# Patient Record
Sex: Male | Born: 1940 | Race: White | Hispanic: No | Marital: Married | Smoking: Never smoker
Health system: Southern US, Community
[De-identification: ages and names within clinical notes are randomized; demographics above are authoritative.]

## PROBLEM LIST (undated history)

## (undated) DIAGNOSIS — I10 Essential (primary) hypertension: Secondary | ICD-10-CM

## (undated) DIAGNOSIS — N289 Disorder of kidney and ureter, unspecified: Secondary | ICD-10-CM

## (undated) DIAGNOSIS — I8390 Asymptomatic varicose veins of unspecified lower extremity: Secondary | ICD-10-CM

## (undated) DIAGNOSIS — Z972 Presence of dental prosthetic device (complete) (partial): Secondary | ICD-10-CM

## (undated) DIAGNOSIS — Z974 Presence of external hearing-aid: Secondary | ICD-10-CM

## (undated) DIAGNOSIS — Z973 Presence of spectacles and contact lenses: Secondary | ICD-10-CM

## (undated) DIAGNOSIS — M1990 Unspecified osteoarthritis, unspecified site: Secondary | ICD-10-CM

## (undated) DIAGNOSIS — L97309 Non-pressure chronic ulcer of unspecified ankle with unspecified severity: Secondary | ICD-10-CM

## (undated) DIAGNOSIS — I480 Paroxysmal atrial fibrillation: Secondary | ICD-10-CM

## (undated) DIAGNOSIS — E785 Hyperlipidemia, unspecified: Secondary | ICD-10-CM

## (undated) DIAGNOSIS — I4821 Permanent atrial fibrillation: Secondary | ICD-10-CM

## (undated) HISTORY — PX: COLONOSCOPY: SHX174

## (undated) HISTORY — DX: Hyperlipidemia, unspecified: E78.5

## (undated) HISTORY — PX: NASAL SINUS SURGERY: SHX719

## (undated) HISTORY — DX: Non-pressure chronic ulcer of unspecified ankle with unspecified severity: L97.309

## (undated) HISTORY — DX: Asymptomatic varicose veins of unspecified lower extremity: I83.90

## (undated) HISTORY — DX: Paroxysmal atrial fibrillation: I48.0

## (undated) HISTORY — DX: Permanent atrial fibrillation: I48.21

## (undated) HISTORY — DX: Essential (primary) hypertension: I10

## (undated) HISTORY — PX: OTHER SURGICAL HISTORY: SHX169

---

## 2002-06-08 ENCOUNTER — Ambulatory Visit (HOSPITAL_COMMUNITY): Admission: RE | Admit: 2002-06-08 | Discharge: 2002-06-08 | Payer: Self-pay | Admitting: Gastroenterology

## 2007-01-27 ENCOUNTER — Encounter: Admission: RE | Admit: 2007-01-27 | Discharge: 2007-01-27 | Payer: Self-pay | Admitting: Endocrinology

## 2010-01-22 ENCOUNTER — Encounter: Payer: Self-pay | Admitting: Endocrinology

## 2010-05-19 NOTE — Op Note (Signed)
   NAME:  Joseph Landry, Joseph Landry                          ACCOUNT NO.:  [id number]   MEDICAL RECORD NO.:  [id number]                   PATIENT TYPE:  AMB   LOCATION:  ENDO                                 FACILITY:  [facility]   PHYSICIAN:  Grayton L. Rolla Flatten., M.D.          DATE OF BIRTH:  07-Jul-1940   DATE OF PROCEDURE:  06/08/2002  DATE OF DISCHARGE:                                 OPERATIVE REPORT   PROCEDURE:  Colonoscopy.   MEDICATIONS:  1. Fentanyl 87.5 mcg IV.  2. Versed 9 mg IV.   ENDOSCOPE:  Olympus pediatric colonoscope.   INDICATIONS FOR PROCEDURE:  Colon cancer screening.   DESCRIPTION OF PROCEDURE:  The procedure had been explained to the patient  and consent obtained.  With the patient in the left lateral decubitus  position, the scope was withdrawn.  The colon was carefully examined upon  withdrawal.   The ascending colon, transverse colon, splenic flexure, descending and  sigmoid colon were all seen well without polyps.  There were no further  abnormalities seen throughout the colon.  The scope was withdrawn and the  patient tolerated the procedure well.   ASSESSMENT:  Essentially normal colonoscopy.   PLAN:  Routine follow-up with yearly Hemoccults.  Consider another screen in  5-10 years.                                                Hersel L. Rolla Flatten., M.D.    Jaynie Bream  D:  06/08/2002  T:  06/08/2002  Job:  [id number]   cc:   Ronaldo Miyamoto, M.D.  [address] [place]  [place] [address]  Fax: [phone]

## 2010-08-02 HISTORY — PX: OTHER SURGICAL HISTORY: SHX169

## 2010-08-02 HISTORY — PX: TRANSTHORACIC ECHOCARDIOGRAM: SHX275

## 2012-05-15 ENCOUNTER — Telehealth: Payer: Self-pay | Admitting: Cardiology

## 2012-05-15 NOTE — Telephone Encounter (Signed)
Received a fax from [facility] asking if the patient can temporarily stop taking his Pradaxa.  05-15-12 ST.

## 2012-07-21 ENCOUNTER — Ambulatory Visit (INDEPENDENT_AMBULATORY_CARE_PROVIDER_SITE_OTHER): Payer: Medicare Other | Admitting: Cardiology

## 2012-07-21 VITALS — BP 170/88 | HR 48 | Ht 72.0 in | Wt 172.4 lb

## 2012-07-21 DIAGNOSIS — I4891 Unspecified atrial fibrillation: Secondary | ICD-10-CM

## 2012-07-21 DIAGNOSIS — E785 Hyperlipidemia, unspecified: Secondary | ICD-10-CM

## 2012-07-21 DIAGNOSIS — I10 Essential (primary) hypertension: Secondary | ICD-10-CM

## 2012-07-22 MED ORDER — FLECAINIDE ACETATE 50 MG PO TABS: 50.0000 mg | ORAL_TABLET | Freq: Two times a day (BID) | ORAL | Status: DC

## 2012-07-22 NOTE — Patient Instructions (Addendum)
Your physician wants you to follow-up in 6 month with Joseph Landry Jan 2015;12 month with Dr Joseph Landry will receive a reminder letter in the mail two months in advance. If you don't receive a letter, please call our office to schedule the follow-up appointment.

## 2012-08-06 ENCOUNTER — Other Ambulatory Visit: Payer: Self-pay | Admitting: *Deleted

## 2012-08-06 ENCOUNTER — Encounter: Payer: Self-pay | Admitting: *Deleted

## 2012-08-06 NOTE — Telephone Encounter (Signed)
Pt would like to keep his prescription as is.  100 mg tablet. Take 1/2 tablet twice a day. (= 50 mg bid)

## 2012-08-08 ENCOUNTER — Encounter: Payer: Self-pay | Admitting: Cardiology

## 2012-08-08 DIAGNOSIS — E785 Hyperlipidemia, unspecified: Secondary | ICD-10-CM | POA: Insufficient documentation

## 2012-08-08 DIAGNOSIS — I4811 Longstanding persistent atrial fibrillation: Secondary | ICD-10-CM | POA: Insufficient documentation

## 2012-08-08 DIAGNOSIS — I10 Essential (primary) hypertension: Secondary | ICD-10-CM | POA: Insufficient documentation

## 2012-08-08 NOTE — Assessment & Plan Note (Signed)
On statin.  Moderate upright position.  Stable.

## 2012-08-08 NOTE — Assessment & Plan Note (Addendum)
Full control on current regimen.  No recurrence since on flecainide.  Otherwise probably back off as metoprolol, he has been taking 100 mg daily. He does continue to have bradycardia, we may need to back off on his beta blocker which would mean we have to add something else for blood pressure control.  Cannot stop beta blocker while on flecainide.  We did already back down to 50 mg twice a day of flecainide. (We will need to clarify from him what dose of metoprolol he has been taking.) He is not have any problems or bleeding on Pradaxa.  No untoward symptoms of nausea.    Plan: Continue flecainide 50 mg twice a day, metoprolol succinate at 100 mg daily for now but can reduce to 50 mg if he does note fatigue, and Pradaxa for anticoagulation

## 2012-08-08 NOTE — Progress Notes (Signed)
Patient ID: Joseph Landry, male   DOB: September 04, 1940, 72 y.o.   MRN: [id number] PCP: No primary provider on file.  Clinic Note: Chief Complaint  Patient presents with  . Follow-up    Atrial fibrillation; energy is much better, feeling overall better   HPI: Joseph Landry is a 72 y.o. male with a PMH most notable for PAF who presents today for routine followup. He was first referred to me back in August 2012 with new diagnosis of A. fib.  Due to full cardiac evaluation which is relatively benign.  With the exception of hypertension and dyslipidemia, he has no other cardiac risk factors.  He initially tried him on Multaq, he was intolerant of Multaq, and had recurrence of A. fib.  He was converted to Flecainide which is done well with preserving normal sinus rhythm, but he has had bradycardia which is let us to decrease both his metoprolol and flecainide dose.  Interval History: He returns today for what amounts to be a prolonged six-month followup.  He is doing extremely well.  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  His walking around or breaks.  He is trying to get back to his routine exercise level.  He just got back from her trip to [place] where he spent for 5 days walker and [place] with his grandson.  His ejection blood pressure at home has been relatively controlled.  He is not sure what happened today the side effects he was somewhat rushed getting to his visit.  As far as he can tell, last on his atrial fibrillation was back in November when we took him off the Multaq and converted to flecainide.  Since we backed off on the dose as his energy levels gotten much better.  Otherwise, the remainder of cardiac review of systems is as follows: Cardiovascular ROS: no chest pain or dyspnea on exertion negative for - edema, irregular heartbeat, loss of consciousness, murmur, orthopnea, palpitations, paroxysmal nocturnal dyspnea, rapid heart rate or shortness  of breath, lightheadedness, dizziness, wooziness, syncope/near-syncope.  No TIA or amaurosis fugax symptoms.  No melena, hematochezia or hematuria  Past Medical History  Diagnosis Date  . PAF (paroxysmal atrial fibrillation)     Rate controlled with beta blocker; rhythm control with flecainide; panic regulation with Pradaxa  . Dyslipidemia      on  simvastatin   . Hypertension     Labile; often elevated in clinic visits, but never at home   Prior Cardiac Evaluation and Past Surgical History: Past Surgical History  Procedure Laterality Date  . Transthoracic echocardiogram  August 2012    Normal LV size and function, normal LA size, EF> 55%.  . Nm myoview ltd - persantine  August 2012    No infarct or ischemia.   Allergies  Allergen Reactions  . Multaq (Dronedarone) Nausea Only    Significant bradycardia    Current Outpatient Prescriptions  Medication Sig Dispense Refill  . dabigatran (PRADAXA) 150 MG CAPS capsule Take 150 mg by mouth every 12 (twelve) hours.      . flecainide (TAMBOCOR) 100 MG tablet Take 1/2 tablet by mouth.  Twice a day      . folic acid (FOLVITE) 1 MG tablet Take 1 mg by mouth daily.      . hydrochlorothiazide (HYDRODIURIL) 25 MG tablet Take 25 mg by mouth daily.      . metoprolol succinate (TOPROL-XL) 50 MG 24 hr tablet Take  50 mg by mouth daily. Take with or immediately following a meal.      . Omega-3 Fatty Acids (FISH OIL) 1000 MG CAPS Take by mouth.      . simvastatin (ZOCOR) 40 MG tablet Take 40 mg by mouth every evening.      . vitamin A 8000 UNIT capsule Take 8,000 Units by mouth daily.      . vitamin C (ASCORBIC ACID) 500 MG tablet Take 500 mg by mouth daily.      . vitamin E (VITAMIN E) 400 UNIT capsule Take 400 Units by mouth daily.       No current facility-administered medications for this visit.   Social History Narrative   He is a married father of 2, grandfather of 1.  He is very active but not with routine exercise. He previously was  walking 2to 3 miles a day and he is trying to get back to that. An occasional social alcoholic drink but does not smoke.       He recently went on a trip with his daughter and grandson to [place] and was able to walk all throughout [employer] [place] without any problems.   ROS: A comprehensive Review of Systems - Was essentially negative.  No weight loss or gain, no fevers chills sweats, no muscular skeletal symptoms.  No claudication symptoms.  No GI or GU symptoms.  No neurologic deficits per no coughing or wheezing or other illnesses.  Otherwise cardiovascular symptoms are negative as above  PHYSICAL EXAM BP 170/88  Pulse 48  Ht 6' (1.829 m)  Wt 172 lb 6.4 oz (78.2 kg)  BMI 23.38 kg/m2 General appearance: alert, cooperative, appears stated age, no distress and Relatively thin but healthy-appearing.  Well-nourished and well-groomed.  Answers questions appropriately. HEENT: [place]/AT, EOMI, MMM, anicteric sclera Neck: no adenopathy, no carotid bruit, no JVD, supple, symmetrical, trachea midline and thyroid not enlarged, symmetric, no tenderness/mass/nodules Lungs: clear to auscultation bilaterally, normal percussion bilaterally and Nonlabored, good air movement Heart: Bradycardic rate and rhythm, S1, S2 normal, no murmur, click, rub or gallop and normal apical impulse Abdomen: soft, non-tender; bowel sounds normal; no masses,  no organomegaly Extremities: extremities normal, atraumatic, no cyanosis or edema, no edema, redness or tenderness in the calves or thighs and no ulcers, gangrene or trophic changes Pulses: 2+ and symmetric  [id number] today: Yes Rate: 48 , Rhythm: Sinus bradycardia with first-degree AV block, otherwise essentially normal, no significant change.   Recent Labs: None checked  ASSESSMENT / PLAN: Stable PAF.  Labile blood pressures.  No problem-specific assessment & plan notes found for this encounter.   Orders Placed This Encounter  Procedures  . EKG 12-Lead    Followup: Six-month followup with Tarri Fuller, PA; 1 year followup with Dr. Leonie Man. Ellyn Hack, M.D., M.S. [facility] [address] [place], [place]  [address]  [phone] Pager # [phone]

## 2012-08-08 NOTE — Assessment & Plan Note (Addendum)
His blood pressure is quite elevated today.  It is never been this high before.  I rechecked it prior to him leaving it was close to 140/70 mmHg.  He does say was a somewhat irritated with his trip to the office today. More than likely, we will need to add additional blood pressure medication.  He is reluctant to take any more medicines; however, if his blood pressures as high as it is now we'll need more control.  I would probably opt for ACE inhibitor or ARB since he has atrial fibrillation and these have been shown to be beneficial.

## 2012-10-27 ENCOUNTER — Other Ambulatory Visit: Payer: Self-pay | Admitting: Cardiology

## 2012-10-27 NOTE — Telephone Encounter (Signed)
Rx was sent to pharmacy electronically. 

## 2013-01-19 ENCOUNTER — Ambulatory Visit (INDEPENDENT_AMBULATORY_CARE_PROVIDER_SITE_OTHER): Payer: Medicare Other | Admitting: Physician Assistant

## 2013-01-19 ENCOUNTER — Encounter: Payer: Self-pay | Admitting: Physician Assistant

## 2013-01-19 VITALS — BP 172/90 | Ht 72.0 in | Wt 179.0 lb

## 2013-01-19 DIAGNOSIS — I4891 Unspecified atrial fibrillation: Secondary | ICD-10-CM

## 2013-01-19 DIAGNOSIS — I480 Paroxysmal atrial fibrillation: Secondary | ICD-10-CM

## 2013-01-19 DIAGNOSIS — I10 Essential (primary) hypertension: Secondary | ICD-10-CM

## 2013-01-19 NOTE — Patient Instructions (Signed)
1.  Target BP at less than 130/80. 2.  Follow up with Dr. Ellyn Hack in 6 months.

## 2013-01-19 NOTE — Assessment & Plan Note (Signed)
Maintaining sinus bradycardia with first degree AVB.  Flecainide, Pradaxa, Toprol XL.  No changes to current therapy.

## 2013-01-19 NOTE — Progress Notes (Signed)
Patient ID: Joseph Landry, male   DOB: August 20, 1940, 73 y.o.   MRN: [id number]    Date:  01/19/2013   ID:  Joseph Landry, DOB 1940/01/08, MRN [id number]  PCP:  Dwan Bolt, MD  Primary Cardiologist:  Ellyn Hack    History of Present Illness: Joseph Landry is a 73 y.o. male with a PMH most notable for PAF, HTN and dyslipidemia.  He initially tried him on Multaq, he was intolerant of Multaq, and had recurrence of A. fib. He was converted to Flecainide which is done well with preserving normal sinus rhythm, but he has had bradycardia which is let us to decrease both his metoprolol and flecainide dose.  The patient presents today for six month evaluation.  He reports doing well.  He exercises by walking on the treadmill several times per week.  He checks his Bp daily and ranges 122-148 sys, 46-61 dias.   ROS: The patient currently denies nausea, vomiting, fever, chest pain, shortness of breath, orthopnea, dizziness, PND, cough, congestion, abdominal pain, hematochezia, melena, lower extremity edema, claudication.  Wt Readings from Last 3 Encounters:  01/19/13 179 lb (81.194 kg)  07/21/12 172 lb 6.4 Joseph (78.2 kg)     Past Medical History  Diagnosis Date  . PAF (paroxysmal atrial fibrillation)     Rate controlled with beta blocker; rhythm control with flecainide; panic regulation with Pradaxa  . Dyslipidemia      on  simvastatin   . Hypertension     Labile; often elevated in clinic visits, but never at home    Current Outpatient Prescriptions  Medication Sig Dispense Refill  . dabigatran (PRADAXA) 150 MG CAPS capsule Take 150 mg by mouth every 12 (twelve) hours.      . flecainide (TAMBOCOR) 100 MG tablet TAKE 1 TABLET BY MOUTH EVERY 12 HOURS  60 tablet  6  . folic acid (FOLVITE) 1 MG tablet Take 1 mg by mouth daily.      . metoprolol succinate (TOPROL-XL) 50 MG 24 hr tablet Take 50 mg by mouth daily. Take with or immediately following a meal.      . Omega-3 Fatty Acids (FISH OIL)  1000 MG CAPS Take by mouth.      . simvastatin (ZOCOR) 40 MG tablet Take 40 mg by mouth every evening.      . triamterene-hydrochlorothiazide (DYAZIDE) 37.5-25 MG per capsule Take 1 capsule by mouth daily.      . vitamin A 8000 UNIT capsule Take 8,000 Units by mouth daily.      . vitamin C (ASCORBIC ACID) 500 MG tablet Take 500 mg by mouth daily.      . vitamin E (VITAMIN E) 400 UNIT capsule Take 400 Units by mouth daily.       No current facility-administered medications for this visit.    Allergies:    Allergies  Allergen Reactions  . Multaq [Dronedarone] Nausea Only    Significant bradycardia    Social History:  The patient  reports that he has never smoked. He does not have any smokeless tobacco history on file. He reports that he drinks alcohol. He reports that he does not use illicit drugs.   Family history:  History reviewed. No pertinent family history.  ROS:  Please see the history of present illness.  All other systems reviewed and negative.   PHYSICAL EXAM: VS:  BP 172/90  Ht 6' (1.829 m)  Wt 179 lb (81.194 kg)  BMI 24.27 kg/m2 Well nourished, well developed, in  no acute distress HEENT: Pupils are equal round react to light accommodation extraocular movements are intact.  Neck: no JVDNo cervical lymphadenopathy. Cardiac: Regular rhythm rate slow without murmurs rubs or gallops. Lungs:  clear to auscultation bilaterally, no wheezing, rhonchi or rales Abd: soft, nontender, positive bowel sounds all quadrants, no hepatosplenomegaly Ext: no lower extremity edema.  2+ radial and dorsalis pedis pulses. Skin: warm and dry Neuro:  Grossly normal  EKG:  Sinus Bradycardia 53 BPM, First degree AVB,  Unchanged  ASSESSMENT AND PLAN:  Problem List Items Addressed This Visit   PAF (paroxysmal atrial fibrillation) - Primary (Chronic)     Maintaining sinus bradycardia with first degree AVB.  Flecainide, Pradaxa, Toprol XL.  No changes to current therapy.    Relevant  Medications      triamterene-hydrochlorothiazide (DYAZIDE) 37.5-25 MG per capsule   Other Relevant Orders      EKG 12-Lead   Hypertension (Chronic)     BP elevated here today and usually is when he comes in.  Fairly well controlled at home.  99991111 systolic.

## 2013-01-19 NOTE — Assessment & Plan Note (Signed)
BP elevated here today and usually is when he comes in.  Fairly well controlled at home.  99991111 systolic.

## 2013-06-02 ENCOUNTER — Telehealth: Payer: Self-pay | Admitting: Cardiology

## 2013-06-02 NOTE — Telephone Encounter (Signed)
DR HILTY SPOKE TO DR Wilson Singer  DR HILTY ROUTED INFORMATION TO DR HARDING.

## 2013-06-02 NOTE — Telephone Encounter (Signed)
Saw patient yesterday with hematuria.  Dr Wilson Singer asked him to stop pradexa (Dr Ellyn Hack prescribed)

## 2013-08-14 ENCOUNTER — Encounter: Payer: Self-pay | Admitting: *Deleted

## 2013-08-17 ENCOUNTER — Ambulatory Visit (INDEPENDENT_AMBULATORY_CARE_PROVIDER_SITE_OTHER): Payer: Medicare Other | Admitting: Cardiology

## 2013-08-17 ENCOUNTER — Encounter: Payer: Self-pay | Admitting: Cardiology

## 2013-08-17 VITALS — BP 180/74 | HR 52 | Ht 71.0 in | Wt 181.6 lb

## 2013-08-17 DIAGNOSIS — E785 Hyperlipidemia, unspecified: Secondary | ICD-10-CM

## 2013-08-17 DIAGNOSIS — K625 Hemorrhage of anus and rectum: Secondary | ICD-10-CM

## 2013-08-17 DIAGNOSIS — I10 Essential (primary) hypertension: Secondary | ICD-10-CM

## 2013-08-17 DIAGNOSIS — I480 Paroxysmal atrial fibrillation: Secondary | ICD-10-CM

## 2013-08-17 DIAGNOSIS — I4891 Unspecified atrial fibrillation: Secondary | ICD-10-CM

## 2013-08-17 DIAGNOSIS — Z87448 Personal history of other diseases of urinary system: Secondary | ICD-10-CM

## 2013-08-17 NOTE — Patient Instructions (Signed)
Hold Pradaxa  Two days before and day of your flight to and from.   If there are no bleeding issues you can stay on Pradaxa if you do have bleeding you can switch to taking an 81mg  Aspirin daily.

## 2013-08-18 DIAGNOSIS — Z87448 Personal history of other diseases of urinary system: Secondary | ICD-10-CM | POA: Insufficient documentation

## 2013-08-18 DIAGNOSIS — Z8719 Personal history of other diseases of the digestive system: Secondary | ICD-10-CM | POA: Insufficient documentation

## 2013-08-18 NOTE — Assessment & Plan Note (Signed)
No real gross finding from his report of GI evaluation. My recommendation is usually to do a month or 2 off of anticoagulation to allow healing and then restarting. If there is bleeding again with Pradaxa, I would consider switching to [facility]. If not on Pradaxa, he should be on at least 81 mg aspirin  CHA2DS2Vasc Score 2.

## 2013-08-18 NOTE — Assessment & Plan Note (Signed)
Probably related to nephrolithiasis. Should not be a reason to avoid anticoagulation

## 2013-08-18 NOTE — Assessment & Plan Note (Signed)
On statin. Monitored by PCP. Notable improvement in April 2015 labs

## 2013-08-18 NOTE — Assessment & Plan Note (Signed)
Strangely, he continues to have elevated blood pressures during evaluations in the office, but has not had the same findings when he checks his blood pressure at home or at the gym. Usually his blood pressures range from 126/51 mm Hg to 143/69 mmHg which is well within normal range.  For now I think we'll continue with his current medications. We may need to use either an ACE inhibitor or ARB for additional blood pressure control which would potentially allow [place] to reduce his Toprol dose to avoid excess bradycardia

## 2013-08-18 NOTE — Assessment & Plan Note (Addendum)
Continued to be rate controlled with sinus bradycardia first degree AV block. On flecainide plus Toprol, as long as the heart rate is still in the roughly 50 beats per minute range I am fine with continuing the current dose of Toprol. Currently anticoagulation is on hold due to his GI bleed and hematuria. I have asked that he start taking 81 mg aspirin while not on Pradaxa.  I have suggested that with his upcoming trip to [place], he may want to be on the Pradaxa for the flight there and back. Maybe not take it while he is there, but use it essentially for DVT prophylaxis. If no further bleeding is noted during the trip back, he may still restart it. If there is further episodes of bleeding, I would consider switching to [facility]. But in the interim he would stop Pradaxa and take aspirin.

## 2013-08-18 NOTE — Progress Notes (Signed)
PCP: Dwan Bolt, MD  Clinic Note: Chief Complaint  Patient presents with  . 6 month visit    no chest pain , no sob , no edema , no palp--- not using Pradaxa- due bleeding been off since JUNE 2015- ---GOING TO [place] nex t month   HPI: Joseph Landry is a 73 y.o. male with a Cardiovascular Problem List below who presents today for what amounts to a 7 month followup. He did see Tenny Craw, PA-C. in January and me in August of last year. He essentially has hypertension and paroxysmal atrial fibrillation treated with metoprolol for rate control and has been on Pradaxa for anticoagulation.  Interval History: Joseph Landry is unrewarding last 6 months or so without any major complaints from a cardiac standpoint. He really has not noted any recurrence of atrial fibrillation type sensations. No rapid or irregular heartbeats, no chest tightness or pressure with rest or exertion. No exertional dyspnea. He is still active but maybe not as much the used to be. He has an upcoming trip to [place] planned. He denies any PND, orthopnea or edema. No TA/amaurosis fugax or syncopal/near-syncope type symptoms.  The notable issues in the last several months have been a couple days of hematuria and mild intermittent blood in stool. He is on for couple by both urology and gastroenterology without any major findings besides mild nephrolithiasis and possible anal fissure. He was found to be anemic according to his report with hemoglobin to 9 range. This subsequently improved with iron therapy. Since the initial bleeding episode, he has not been taking Pradaxa.  Past Medical History  Diagnosis Date  . PAF (paroxysmal atrial fibrillation)     Rate controlled with beta blocker; rhythm control with flecainide; panic regulation with Pradaxa  . Dyslipidemia      on  simvastatin   . Hypertension     Labile; often elevated in clinic visits, but never at home    Prior Cardiac Evaluation and Past Surgical History: Past  Surgical History  Procedure Laterality Date  . Transthoracic echocardiogram  August 2012    Normal LV size and function, normal LA size, EF> 55%.  . Nm myoview ltd - persantine  August 2012    No infarct or ischemia.  . Event monitor  08/22/2011-09/20/2011    shows a fib- startedon FLECAINDE    MEDICATIONS AND ALLERGIES REVIEWED IN EPIC No Change in Social and Family History  ROS: A comprehensive Review of Systems was performed Review of Systems  Constitutional: Negative for fever and chills.       Weight Gain  HENT: Negative.   Respiratory: Negative for cough, hemoptysis, sputum production and wheezing.   Cardiovascular: Negative.        Per HPI  Gastrointestinal: Positive for blood in stool. Negative for abdominal pain and melena.       Evaluated by GI after noted ~1-2 days of "specks of blood in stool", noted to be anemic, started on Iron No significant EGD or Colonoscopy findings.  Genitourinary: Positive for hematuria. Negative for dysuria and flank pain.       ~2 days of hematuria - resolved Full Urology evaluation with CT & cystoscopy --> only small stones noted; presumed hematuria was related to passing small stones  Skin: Negative.   Neurological: Negative for dizziness, sensory change, speech change, focal weakness, seizures and loss of consciousness.  Endo/Heme/Allergies: Does not bruise/bleed easily.  All other systems reviewed and are negative.  Wt Readings from Last 3 Encounters:  08/17/13 181 lb 9.6 oz (82.373 kg)  01/19/13 179 lb (81.194 kg)  07/21/12 172 lb 6.4 oz (78.2 kg)   PHYSICAL EXAM BP 180/74  Pulse 52  Ht 5\' 11"  (1.803 m)  Wt 181 lb 9.6 oz (82.373 kg)  BMI 25.34 kg/m2 General appearance: alert, cooperative, appears stated age, no distress; well-nourished and well-groomed HEENT: [place]/AT, EOMI, MMM, anicteric sclera Neck: no adenopathy, no carotid bruit and no JVD Lungs: clear to auscultation bilaterally, normal percussion bilaterally and  non-labored Heart: regular rate and rhythm, S1, S2 normal, no murmur, click, rub or gallop; nondisplaced PMI Abdomen: soft, non-tender; bowel sounds normal; no masses,  no organomegaly; Extremities: extremities normal, atraumatic, no cyanosis, or edema Pulses: 2+ and symmetric; Neurologic: Mental status: Alert, oriented, thought content appropriate; grossly normal   Adult ECG Report  Rate: 52 ;  Rhythm: sinus bradycardia and First degree AV block; otherwise normal EKG   Recent Labs April 2015:   TC 131, TG 85, HDL 39 (up from 36), LDL- C. 75/ P 735 (down from 73/1291), small LDL-P 406 (down from 773)  No recent CBC provided  ASSESSMENT / PLAN: PAF (paroxysmal atrial fibrillation) Continued to be rate controlled with sinus bradycardia first degree AV block. On flecainide plus Toprol, as long as the heart rate is still in the roughly 50 beats per minute range I am fine with continuing the current dose of Toprol. Currently anticoagulation is on hold due to his GI bleed and hematuria. I have asked that he start taking 81 mg aspirin while not on Pradaxa.  I have suggested that with his upcoming trip to [place], he may want to be on the Pradaxa for the flight there and back. Maybe not take it while he is there, but use it essentially for DVT prophylaxis. If no further bleeding is noted during the trip back, he may still restart it. If there is further episodes of bleeding, I would consider switching to [facility]. But in the interim he would stop Pradaxa and take aspirin.  H/O: GI bleed  No real gross finding from his report of GI evaluation. My recommendation is usually to do a month or 2 off of anticoagulation to allow healing and then restarting. If there is bleeding again with Pradaxa, I would consider switching to [facility]. If not on Pradaxa, he should be on at least 81 mg aspirin  CHA2DS2Vasc Score 2.  H/O hematuria Probably related to nephrolithiasis. Should not be a reason to avoid  anticoagulation  Essential hypertension Strangely, he continues to have elevated blood pressures during evaluations in the office, but has not had the same findings when he checks his blood pressure at home or at the gym. Usually his blood pressures range from 126/51 mm Hg to 143/69 mmHg which is well within normal range.  For now I think we'll continue with his current medications. We may need to use either an ACE inhibitor or ARB for additional blood pressure control which would potentially allow [place] to reduce his Toprol dose to avoid excess bradycardia  Dyslipidemia On statin. Monitored by PCP. Notable improvement in April 2015 labs    Orders Placed This Encounter  Procedures  . EKG 12-Lead   Meds ordered this encounter  Medications  . Iron-FA-B Cmp-C-Biot-Probiotic (FUSION PLUS PO)    Sig: Take by mouth.  . flecainide (TAMBOCOR) 100 MG tablet    Sig: TAKE 1/2 TABLET  OF(1OO MG)  TWICE A DAY    Followup: Roughly 6 months  DAVID W.  Ellyn Hack, M.D., M.S. Interventional Cardiologist [facility]

## 2013-11-13 ENCOUNTER — Encounter: Payer: Self-pay | Admitting: Physician Assistant

## 2013-11-13 ENCOUNTER — Ambulatory Visit (INDEPENDENT_AMBULATORY_CARE_PROVIDER_SITE_OTHER): Payer: Medicare Other | Admitting: Physician Assistant

## 2013-11-13 ENCOUNTER — Ambulatory Visit (HOSPITAL_COMMUNITY)
Admission: RE | Admit: 2013-11-13 | Discharge: 2013-11-13 | Disposition: A | Discharge status: Home / Self Care | Payer: Medicare Other | Source: Ambulatory Visit | Attending: Internal Medicine | Admitting: Internal Medicine

## 2013-11-13 VITALS — BP 184/82 | HR 58 | Ht 71.0 in | Wt 181.8 lb

## 2013-11-13 DIAGNOSIS — L97319 Non-pressure chronic ulcer of right ankle with unspecified severity: Secondary | ICD-10-CM

## 2013-11-13 DIAGNOSIS — I10 Essential (primary) hypertension: Secondary | ICD-10-CM

## 2013-11-13 DIAGNOSIS — I872 Venous insufficiency (chronic) (peripheral): Secondary | ICD-10-CM

## 2013-11-13 DIAGNOSIS — I480 Paroxysmal atrial fibrillation: Secondary | ICD-10-CM

## 2013-11-13 DIAGNOSIS — L97309 Non-pressure chronic ulcer of unspecified ankle with unspecified severity: Secondary | ICD-10-CM | POA: Insufficient documentation

## 2013-11-13 DIAGNOSIS — L97312 Non-pressure chronic ulcer of right ankle with fat layer exposed: Secondary | ICD-10-CM

## 2013-11-13 NOTE — Assessment & Plan Note (Signed)
Patient reports development of an ulcer in approximately June of this year which is located posterior to the medial malleolus.  Is about 2 cm and all monitored shape. Mild erythema. Decreased femoral and popliteal and distal pulses. Will start with arterial Dopplers and if need be, proceeding with a venous Doppler.  I suspect however this is an arterial issue.

## 2013-11-13 NOTE — Progress Notes (Signed)
Right Lower Ext. Arterial Duplex Completed. Keyari Kleeman, BS, RDMS, RVT  

## 2013-11-13 NOTE — Progress Notes (Signed)
Date:  11/13/2013   ID:  Joseph Landry, DOB 12-28-1940, MRN [id number]  PCP:  Joseph Bolt, MD  Primary Cardiologist:  Joseph Landry    History of Present Illness: Joseph Landry is a 73 y.o. male  with a PMH most notable for PAF, HTN and dyslipidemia. He initially tried him on Multaq, he was intolerant of Multaq, and had recurrence of A. fib. He was converted to Flecainide which is done well with preserving normal sinus rhythm, but he has had bradycardia which is let us to decrease both his metoprolol and flecainide dose.  Patient presented today with an ulcer on his right ankle. He says its started on in June of this year. He has had a previous ulcers inferior to this one approximately 3 years ago which have healed. 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 He was recently in [place] and did a lot of walking however, does not really complain of having any pain.  Dr. Wilson Landry recently started him on antibiotic. Patient is due to be seen at the wound clinic in about 2 weeks.  The patient currently denies nausea, vomiting, fever, chest pain, shortness of breath, orthopnea, dizziness, PND, cough, congestion, abdominal pain, hematochezia, melena, lower extremity edema.  Wt Readings from Last 3 Encounters:  11/13/13 181 lb 12.8 oz (82.464 kg)  08/17/13 181 lb 9.6 oz (82.373 kg)  01/19/13 179 lb (81.194 kg)     Past Medical History  Diagnosis Date  . PAF (paroxysmal atrial fibrillation)     Rate controlled with beta blocker; rhythm control with flecainide; panic regulation with Pradaxa  . Dyslipidemia      on  simvastatin   . Hypertension     Labile; often elevated in clinic visits, but never at home    Current Outpatient Prescriptions  Medication Sig Dispense Refill  . amoxicillin-clavulanate (AUGMENTIN) 875-125 MG per tablet Take 1 tablet by mouth daily.  1  . aspirin 81 MG tablet Take 81 mg by mouth daily.    . betamethasone valerate ointment (VALISONE) 0.1 % Apply 1 application  topically daily.  1  . flecainide (TAMBOCOR) 100 MG tablet TAKE 1/2 TABLET  OF(1OO MG)  TWICE A DAY    . folic acid (FOLVITE) 1 MG tablet Take 400 mg by mouth daily.     . Iron-FA-B Cmp-C-Biot-Probiotic (FUSION PLUS PO) Take by mouth.    . metoprolol succinate (TOPROL-XL) 100 MG 24 hr tablet Take 1 tablet by mouth daily.  0  . Omega-3 Fatty Acids (FISH OIL) 1000 MG CAPS Take by mouth.    . simvastatin (ZOCOR) 40 MG tablet Take 40 mg by mouth every evening.    . triamterene-hydrochlorothiazide (DYAZIDE) 37.5-25 MG per capsule Take 1 capsule by mouth daily.    . vitamin A 8000 UNIT capsule Take 8,000 Units by mouth daily.    . vitamin C (ASCORBIC ACID) 500 MG tablet Take 500 mg by mouth daily.    . vitamin E (VITAMIN E) 400 UNIT capsule Take 400 Units by mouth daily.     No current facility-administered medications for this visit.    Allergies:    Allergies  Allergen Reactions  . Multaq [Dronedarone] Nausea Only    Significant bradycardia    Social History:  The patient  reports that he has never smoked. He does not have any smokeless tobacco history on file. He reports that he drinks alcohol. He reports that he does not use illicit drugs.  Family history:   Family History  Problem Relation Age of Onset  . Stroke Mother 43  . Heart disease Mother     ROS:  Please see the history of present illness.  All other systems reviewed and negative.   PHYSICAL EXAM: VS:  BP 184/82 mmHg  Pulse 58  Ht 5\' 11"  (1.803 m)  Wt 181 lb 12.8 oz (82.464 kg)  BMI 25.37 kg/m2 Well nourished, well developed, in no acute distress HEENT: Pupils are equal round react to light accommodation extraocular movements are intact.  Neck: no JVDNo cervical lymphadenopathy. Cardiac: Regular rate and rhythm without murmurs rubs or gallops. Lungs:  clear to auscultation bilaterally, no wheezing, rhonchi or rales Abd: soft, nontender, positive bowel sounds all quadrants, no hepatosplenomegaly Ext: no lower  extremity edema.  2+ radial and left dorsalis pedis pulses.  Right leg 1+ femoral pulses 1+ popliteal 1+ dorsalis pedis and 0 posterior tibialis. Skin: warm and dry.  Ulcer: posterior to the right medial malleolus.  Is about 2 cm and oval shape. Mild erythema. Yellow fascia base Neuro:  Grossly normal    ASSESSMENT AND PLAN:  Problem List Items Addressed This Visit    Ankle ulcer - Primary    Patient reports development of an ulcer in approximately June of this year which is located posterior to the medial malleolus.  Is about 2 cm and oval shape. Mild erythema. Decreased femoral and popliteal and distal pulses. Will start with arterial Dopplers and if need be, proceeding with a venous Doppler.  I suspect however this is an arterial issue.    Essential hypertension (Chronic)    Blood pressure is elevated this morning however the patient showed me his charted blood pressures from home and they range in no [id number] systolic. His blood pressure couple weeks ago Dr. Eugenio Hoes office was okay.    Relevant Medications      metoprolol succinate (TOPROL-XL) 100 MG 24 hr tablet      aspirin 81 MG tablet   PAF (paroxysmal atrial fibrillation) (Chronic)    Rhythm is regular on exam    Relevant Medications      metoprolol succinate (TOPROL-XL) 100 MG 24 hr tablet      aspirin 81 MG tablet    Other Visit Diagnoses    Ulcer of ankle, right, with unspecified severity        Relevant Orders       Lower Extremity Arterial Duplex Right       Lower Extremity Venous Duplex Right

## 2013-11-13 NOTE — Patient Instructions (Addendum)
Your physician recommends that you schedule a follow-up appointment in: After Test with Dr Gwenlyn Found  Your physician has requested that you have a lower extremity arterial duplex. This test is an ultrasound of the arteries in the legs or arms. It looks at arterial blood flow in the legs and arms. Allow one hour for Lower and Upper Arterial scans. There are no restrictions or special instructions  Your physician has requested that you have a lower extremity venous duplex. This test is an ultrasound of the veins in the legs or arms. It looks at venous blood flow that carries blood from the heart to the legs or arms. Allow one hour for a Lower Venous exam. Allow thirty minutes for an Upper Venous exam. There are no restrictions or special instructions.  You have been referred to Dr Sherren Mocha Early at [facility]

## 2013-11-13 NOTE — Assessment & Plan Note (Signed)
Rhythm is regular on exam

## 2013-11-13 NOTE — Assessment & Plan Note (Signed)
Blood pressure is elevated this morning however the patient showed me his charted blood pressures from home and they range in no [id number] systolic. His blood pressure couple weeks ago Dr. Eugenio Hoes office was okay.

## 2013-11-13 NOTE — Addendum Note (Signed)
Addended by: Vennie Homans on: 11/13/2013 02:56 PM   Modules accepted: Orders

## 2013-11-16 ENCOUNTER — Telehealth (HOSPITAL_COMMUNITY): Payer: Self-pay | Admitting: *Deleted

## 2013-11-17 ENCOUNTER — Telehealth: Payer: Self-pay | Admitting: Cardiology

## 2013-11-17 NOTE — Telephone Encounter (Signed)
Per BRIANA,  VENOUS DOPPLER WERE CANCELLED AT [facility], PATIENT HAS APPOINTMENT WITH [facility].

## 2013-11-17 NOTE — Telephone Encounter (Signed)
Spoke to patient He states his pcp wanted to know what Dr Ellyn Hack wanted to do concerning Pradaxa. RN informed patient will defer to Dr Ellyn Hack. RN reviewed last office note from 08/2013- per note Dr Ellyn Hack wanted patient to restart Pradaxa after holding medication for1-2 monhts. RN informed patient - to restart Pradaxa and call the office if bleeding reoccur. Patient voiced understanding.  Patient also had a question concerning dopplers he received last week.  He states he had the arterial and venous done together- and an appointment was made with Dr [name]. Patient states Velva Harman was the person who did the tests. RN states she will discuss with RITA and call him back

## 2013-11-17 NOTE — Telephone Encounter (Signed)
Pt called in wanting to speak to Ivin Booty about if it is all right for him to start back taking his Pradaxa again. Please call  Thanks

## 2013-11-18 ENCOUNTER — Inpatient Hospital Stay (HOSPITAL_COMMUNITY): Admission: RE | Admit: 2013-11-18 | Payer: Medicare Other | Source: Ambulatory Visit

## 2013-11-20 ENCOUNTER — Telehealth: Payer: Self-pay | Admitting: *Deleted

## 2013-11-20 ENCOUNTER — Other Ambulatory Visit: Payer: Self-pay

## 2013-11-20 DIAGNOSIS — I83019 Varicose veins of right lower extremity with ulcer of unspecified site: Secondary | ICD-10-CM

## 2013-11-20 DIAGNOSIS — L97919 Non-pressure chronic ulcer of unspecified part of right lower leg with unspecified severity: Principal | ICD-10-CM

## 2013-11-20 NOTE — Telephone Encounter (Signed)
-----   Message from Lorretta Harp, MD sent at 11/17/2013  8:12 AM EST ----- Probably venous ulcer. Nl ABIs and velocities. No further arterial dopplers necessary

## 2013-11-20 NOTE — Telephone Encounter (Signed)
Left message on home answering machine that leg arterial blood flow is normal and that Dr. Gwenlyn Found recommended that the ulcer was of venous origin not arterial. If any questions or concerns to call back.

## 2013-11-23 ENCOUNTER — Encounter (HOSPITAL_BASED_OUTPATIENT_CLINIC_OR_DEPARTMENT_OTHER): Payer: Medicare Other | Attending: Plastic Surgery

## 2013-11-23 DIAGNOSIS — I872 Venous insufficiency (chronic) (peripheral): Secondary | ICD-10-CM | POA: Insufficient documentation

## 2013-11-23 DIAGNOSIS — L97319 Non-pressure chronic ulcer of right ankle with unspecified severity: Secondary | ICD-10-CM | POA: Insufficient documentation

## 2013-11-23 NOTE — Progress Notes (Signed)
[facility]  NAME:  Joseph Landry, YAPP                ACCOUNT NO.:  [id number]  MEDICAL RECORD NO.:  [id number]      DATE OF BIRTH:  06-Mar-1940  PHYSICIAN:  Theodoro Kos, DO       VISIT DATE:  11/23/2013                                  OFFICE VISIT   HISTORY OF PRESENT ILLNESS:  The patient is here for evaluation of a right ankle ulcer, chronic venous insufficiency.  He has been placing triple antibiotic ointment on the area without any improvement for the past several months.  He has had history of chronic venous insufficiency ulcers in the past.  PAST MEDICAL HISTORY:  Positive for, 1. Atrial fibrillation. 2. Anemia. 3. Hypertension. 4. Venous insufficiency. 5. Hyperlipidemia. 6. He has had sinus surgery and skin cancer removal.  ALLERGIES:  None.  MEDICATIONS:  Pradaxa, metoprolol, simvastatin, triamterene, Augmentin, fish oil, folic acid, vitamin A, vitamin E, vitamin C, flecainide.  REVIEW OF SYSTEMS:  Negative.  He has no complaints than otherwise stated.  PHYSICAL EXAMINATION:  GENERAL:  On exam, he is alert, oriented, very pleasant. EYES:  Pupils are equal. LUNGS:  His breathing is unlabored. HEART:  Heart rate is regular. ABDOMEN:  Soft. EXTREMITIES:  His upper extremities and lower extremity pulses are equal.  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  The wound sizes noted in the nurse's notes were reviewed.  The wound was debrided with a curette.  My recommendation is for Santyl and collagen.  We will see how he does over the next week.  We may need to take him to the OR, but we will take a look at it in a week and re-evaluate after the debridement.     Theodoro Kos, DO     CS/MEDQ  D:  11/23/2013  T:  11/23/2013  Job:  [id number]

## 2013-11-30 DIAGNOSIS — I872 Venous insufficiency (chronic) (peripheral): Secondary | ICD-10-CM | POA: Diagnosis not present

## 2013-11-30 DIAGNOSIS — L97319 Non-pressure chronic ulcer of right ankle with unspecified severity: Secondary | ICD-10-CM | POA: Diagnosis not present

## 2013-11-30 NOTE — Progress Notes (Signed)
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  He has been using Santyl on it and changing it every other day with some improvement.  There has been no change in his medications.  His pre- albumin came back within normal limits, at 26.6.  PHYSICAL EXAMINATION:  He is alert, oriented, cooperative, very pleasant.  His pupils are equal.  His glasses are on.  His breathing is unlabored.  His heart rate is regular. His abdomen is soft and nontender.  His lower extremity; he has varicose veins noted.  Pulses present.  The wound is showing some signs of improvement, although slow.  Debridement was done and those notes are noted in the chart.  At this point, I do want him to continue with dressing changes every other day with the Santyl and wrap; and if we do not see improvement in the next 1-2 weeks, then we will need to go to the OR for debridement and he understands that.  He needs to keep it elevated and continue with protein, multivitamin, and vitamin C.     Theodoro Kos, DO     CS/MEDQ  D:  11/30/2013  T:  11/30/2013  Job:  [id number]

## 2013-12-07 ENCOUNTER — Encounter (HOSPITAL_BASED_OUTPATIENT_CLINIC_OR_DEPARTMENT_OTHER): Payer: Medicare Other | Attending: Plastic Surgery

## 2013-12-07 DIAGNOSIS — I872 Venous insufficiency (chronic) (peripheral): Secondary | ICD-10-CM | POA: Diagnosis present

## 2013-12-07 DIAGNOSIS — L97311 Non-pressure chronic ulcer of right ankle limited to breakdown of skin: Secondary | ICD-10-CM | POA: Diagnosis not present

## 2013-12-07 DIAGNOSIS — I8390 Asymptomatic varicose veins of unspecified lower extremity: Secondary | ICD-10-CM | POA: Diagnosis not present

## 2013-12-07 NOTE — Progress Notes (Signed)
[facility]  NAME:  Joseph Landry, Joseph Landry                ACCOUNT NO.:  [id number]  MEDICAL RECORD NO.:  [id number]      DATE OF BIRTH:  March 07, 1940  PHYSICIAN:  Theodoro Kos, DO       VISIT DATE:  12/07/2013                                  OFFICE VISIT   HISTORY OF PRESENT ILLNESS:  The patient is a 73 year old gentleman who is here for a followup on his right lower extremity medial ankle chronic venous insufficiency ulcer.  He has been using Santyl and collagen. There has been a little improvement but not much.  There is a little bit of granulation tissue at the base.  There has been no change in his medications or social history.  REVIEW OF SYSTEMS:  Otherwise negative.  PHYSICAL EXAMINATION:  GENERAL:  On exam, he is alert, oriented, cooperative, not in any distress.  He is pleasant.  He seems to be a good historian. RESPIRATORY:  His breathing is unlabored. CARDIOVASCULAR:  His heart rate is regular. EXTREMITIES:  He had varicose veins of the lower extremity with some petechiae and __________ from previous ulcers.  ASSESSMENT AND PLAN:  The ulcer is on the medial aspect of his right ankle.  A little bit of debridement was done with a curette, but he has a fibrous base that I am not able to get debrided with the curette here in the office.  He is on Pradaxa.  So, he would bleed, potentially too much here in the office.  So, my recommendation is to do a debridement in the OR, and he is in agreement to that.  We will make those arrangements.  In the meantime, he can continue with the Santyl, collagen, elevation, multivitamin, vitamin C, zinc, and protein; and we will see him next week.     Theodoro Kos, DO     CS/MEDQ  D:  12/07/2013  T:  12/07/2013  Job:  [id number]

## 2013-12-08 ENCOUNTER — Other Ambulatory Visit: Payer: Self-pay | Admitting: Cardiology

## 2013-12-10 ENCOUNTER — Telehealth: Payer: Self-pay | Admitting: Cardiology

## 2013-12-10 NOTE — Telephone Encounter (Signed)
Spoke with pt, aware dr harding is out until next week but will forward for his review.

## 2013-12-10 NOTE — Telephone Encounter (Signed)
Please call,pt is going to have foot surrgery on 12-31-13. He will need to stop his Pradaxa for 5 days. Is this all right?

## 2013-12-10 NOTE — Telephone Encounter (Signed)
Rx was sent to pharmacy electronically. 

## 2013-12-10 NOTE — Telephone Encounter (Signed)
Stopping pradaxa x 3 days prior to his operation & starting back the next day will be fine.  Leonie Man, MD

## 2013-12-11 NOTE — Telephone Encounter (Signed)
Spoke to patient  Information given to patient . Hold pradaxa for 3 days then restart day after. Patient verbalized understanding.

## 2013-12-14 DIAGNOSIS — I872 Venous insufficiency (chronic) (peripheral): Secondary | ICD-10-CM | POA: Diagnosis not present

## 2013-12-14 DIAGNOSIS — L97311 Non-pressure chronic ulcer of right ankle limited to breakdown of skin: Secondary | ICD-10-CM | POA: Diagnosis not present

## 2013-12-14 DIAGNOSIS — I8390 Asymptomatic varicose veins of unspecified lower extremity: Secondary | ICD-10-CM | POA: Diagnosis not present

## 2013-12-15 NOTE — Progress Notes (Signed)
[facility]  NAME:  Joseph Landry, Joseph Landry                ACCOUNT NO.:  [id number]  MEDICAL RECORD NO.:  [id number]      DATE OF BIRTH:  [birth year]  PHYSICIAN:  Theodoro Kos, DO       VISIT DATE:  12/14/2013                                  OFFICE VISIT   The patient is a 73 year old gentleman who is here for followup on his right lower extremity chronic venous insufficiency ulcer on the medial portion of his foot.  He has been using Santyl.  There is some improvement with granulation tissue.  There is no sign of infection. Overall, he is pleased with the progress, but it has been extremely slow.  His prealbumin is within normal limits.  His review of systems is negative.  He is alert, oriented, cooperative, not in any distress.  His breathing is unlabored.  His heart rate is regular.  The wound does not appear to be infected.  It has a little bit of granulation tissue, but still has some fibrous tissue at the base.  So, the plan is to take him to the OR in the next 2 weeks for debridement and ACell placement.     Theodoro Kos, DO     CS/MEDQ  D:  12/14/2013  T:  12/15/2013  Job:  [id number]

## 2013-12-21 ENCOUNTER — Encounter: Payer: Self-pay | Admitting: Vascular Surgery

## 2013-12-22 ENCOUNTER — Ambulatory Visit (HOSPITAL_COMMUNITY)
Admission: RE | Admit: 2013-12-22 | Discharge: 2013-12-22 | Disposition: A | Discharge status: Home / Self Care | Payer: Medicare Other | Source: Ambulatory Visit | Attending: Vascular Surgery | Admitting: Vascular Surgery

## 2013-12-22 ENCOUNTER — Encounter: Payer: Self-pay | Admitting: Vascular Surgery

## 2013-12-22 ENCOUNTER — Ambulatory Visit (INDEPENDENT_AMBULATORY_CARE_PROVIDER_SITE_OTHER): Payer: Medicare Other | Admitting: Vascular Surgery

## 2013-12-22 VITALS — BP 147/83 | HR 60 | Resp 18 | Ht 71.0 in | Wt 182.0 lb

## 2013-12-22 DIAGNOSIS — L97919 Non-pressure chronic ulcer of unspecified part of right lower leg with unspecified severity: Secondary | ICD-10-CM

## 2013-12-22 DIAGNOSIS — I83013 Varicose veins of right lower extremity with ulcer of ankle: Secondary | ICD-10-CM

## 2013-12-22 DIAGNOSIS — I872 Venous insufficiency (chronic) (peripheral): Secondary | ICD-10-CM | POA: Diagnosis not present

## 2013-12-22 DIAGNOSIS — I83014 Varicose veins of right lower extremity with ulcer of heel and midfoot: Secondary | ICD-10-CM

## 2013-12-22 DIAGNOSIS — L97319 Non-pressure chronic ulcer of right ankle with unspecified severity: Secondary | ICD-10-CM | POA: Insufficient documentation

## 2013-12-22 DIAGNOSIS — I83012 Varicose veins of right lower extremity with ulcer of calf: Secondary | ICD-10-CM

## 2013-12-22 DIAGNOSIS — B019 Varicella without complication: Secondary | ICD-10-CM

## 2013-12-22 DIAGNOSIS — I83011 Varicose veins of right lower extremity with ulcer of thigh: Secondary | ICD-10-CM

## 2013-12-22 DIAGNOSIS — I83015 Varicose veins of right lower extremity with ulcer other part of foot: Secondary | ICD-10-CM

## 2013-12-22 DIAGNOSIS — I83019 Varicose veins of right lower extremity with ulcer of unspecified site: Secondary | ICD-10-CM

## 2013-12-22 DIAGNOSIS — I83018 Varicose veins of right lower extremity with ulcer other part of lower leg: Secondary | ICD-10-CM

## 2013-12-22 NOTE — Progress Notes (Signed)
Patient name: Joseph Landry MRN: [id number] DOB: 1940-05-16 Sex: male   Referred by: Samara Snide  Reason for referral:  Chief Complaint  Patient presents with  . 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 He reports that he has had very slow healing and is being seen at the wound center. This is being treated with enzymatic debridement and compression and he is scheduled for surgical debridement of this area in approximately 1 week. He has a very long history of marked varicosities in his right calf and pretibial area. He has some pain associated with this. 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 He reports that he recently had a trip to [place] and had significant swelling associated with it more walking than typical. He has been on oral antibiotic but I do not see any evidence of invasive infection erythema and he will discontinue this.  Past Medical History  Diagnosis Date  . PAF (paroxysmal atrial fibrillation)     Rate controlled with beta blocker; rhythm control with flecainide; panic regulation with Pradaxa  . Dyslipidemia      on  simvastatin   . Hypertension     Labile; often elevated in clinic visits, but never at home    Past Surgical History  Procedure Laterality Date  . Transthoracic echocardiogram  August 2012    Normal LV size and function, normal LA size, EF> 55%.  . Nm myoview ltd - persantine  August 2012    No infarct or ischemia.  . Event monitor  08/22/2011-09/20/2011    shows a fib- startedon FLECAINDE    History   Social History  . Marital Status: Married    Spouse Name: N/A    Number of Children: N/A  . Years of Education: N/A   Occupational History  . Not on file.   Social History Main Topics  . Smoking status: Never Smoker   . Smokeless tobacco: Never Used  . Alcohol Use: 0.0 oz/week    0  Not specified per week     Comment: Only on occasion  . Drug Use: No  . Sexual Activity: Not on file   Other Topics Concern  . Not on file   Social History Narrative   He is a married father of 2, grandfather of 1.  He is very active but not with routine exercise. He previously was walking 2to 3 miles a day and he is trying to get back to that. An occasional social alcoholic drink but does not smoke.       He recently went on a trip with his daughter and grandson to [place] and was able to walk all throughout [place] without any problems.    Family History  Problem Relation Age of Onset  . Stroke Mother 27  . Heart disease Mother     Allergies as of 12/22/2013 - Review Complete 12/22/2013  Allergen Reaction Noted  . Multaq [dronedarone] Nausea Only 08/08/2012    Current Outpatient Prescriptions on File Prior to Visit  Medication Sig Dispense Refill  . amoxicillin-clavulanate (AUGMENTIN) 875-125 MG per tablet Take 1 tablet by mouth daily.  1  . aspirin 81 MG tablet Take 81 mg by mouth daily.    . betamethasone valerate ointment (VALISONE) 0.1 % Apply 1 application topically daily.  1  .  flecainide (TAMBOCOR) 100 MG tablet TAKE 1/2 TABLET  OF(1OO MG)  TWICE A DAY    . flecainide (TAMBOCOR) 100 MG tablet Take 0.5 tablets (50 mg total) by mouth every 12 (twelve) hours. 30 tablet 6  . folic acid (FOLVITE) 1 MG tablet Take 400 mg by mouth daily.     . Iron-FA-B Cmp-C-Biot-Probiotic (FUSION PLUS PO) Take by mouth.    . metoprolol succinate (TOPROL-XL) 100 MG 24 hr tablet Take 1 tablet by mouth daily.  0  . Omega-3 Fatty Acids (FISH OIL) 1000 MG CAPS Take by mouth.    . simvastatin (ZOCOR) 40 MG tablet Take 40 mg by mouth every evening.    . triamterene-hydrochlorothiazide (DYAZIDE) 37.5-25 MG per capsule Take 1 capsule by mouth daily.    . vitamin A 8000 UNIT capsule Take 8,000 Units by mouth daily.    . vitamin C (ASCORBIC ACID) 500 MG tablet Take 500 mg by mouth daily.      . vitamin E (VITAMIN E) 400 UNIT capsule Take 400 Units by mouth daily.     No current facility-administered medications on file prior to visit.     REVIEW OF SYSTEMS:  Positives indicated with an "X"  CARDIOVASCULAR:  [ ]  chest pain   [ ]  chest pressure   [ ]  palpitations   [ ]  orthopnea   [ ]  dyspnea on exertion   [ ]  claudication   [ ]  rest pain   [ ]  DVT   [ ]  phlebitis PULMONARY:   [ ]  productive cough   [ ]  asthma   [ ]  wheezing NEUROLOGIC:   [ ]  weakness  [ ]  paresthesias  [ ]  aphasia  [ ]  amaurosis  [ ]  dizziness HEMATOLOGIC:   [ ]  bleeding problems   [ ]  clotting disorders MUSCULOSKELETAL:  [ ]  joint pain   [ ]  joint swelling GASTROINTESTINAL: [ ]   blood in stool  [ ]   hematemesis GENITOURINARY:  [ ]   dysuria  [ ]   hematuria PSYCHIATRIC:  [ ]  history of major depression INTEGUMENTARY:  [ ]  rashes  [x]  ulcers CONSTITUTIONAL:  [ ]  fever   [ ]  chills  PHYSICAL EXAMINATION:  General: The patient is a well-nourished male, in no acute distress. Vital signs are BP 147/83 mmHg  Pulse 60  Resp 18  Ht 5\' 11"  (1.803 m)  Wt 182 lb (82.555 kg)  BMI 25.40 kg/m2 Pulmonary: There is a good air exchange  Musculoskeletal: There are no major deformities.  There is no significant extremity pain. Neurologic: No focal weakness or paresthesias are detected, Skin: Marked changes of chronic venous hypertension in his medial right ankle with a 1 x 3 cm open ulcer with a clean granulating base Psychiatric: The patient has normal affect. Cardiovascular: There is a regular rate and rhythm without significant murmur appreciated. Palpable dorsalis pedis pulses bilaterally  V[facility] Vascular Lab Studies:  Ordered and Independently Reviewed dilatation and reflux in his right great saphenous vein. Reflux with no dilatation and his right small saphenous vein.  Arterial study from 07/13/2013 an outlying lab shows normal arterial flow  Impression and Plan:  Severe venous hypertension and venous  stasis ulceration. He clearly is a failed conservative treatment with a six-month history of ulcer. He does have severe venous hypertension. I have recommended laser ablation of his right say saphenous vein. I expect that he will also require stab phlebectomy of his saphenous vein and subsequent procedure. I would recommend observation only of his small  saphenous vein currently since this does not have any significant dilatation. We will schedule this at his earliest convenience    Shamon Lobo [facility] Office: [phone]

## 2013-12-24 ENCOUNTER — Other Ambulatory Visit: Payer: Self-pay | Admitting: *Deleted

## 2013-12-24 DIAGNOSIS — L97319 Non-pressure chronic ulcer of right ankle with unspecified severity: Principal | ICD-10-CM

## 2013-12-24 DIAGNOSIS — I83013 Varicose veins of right lower extremity with ulcer of ankle: Secondary | ICD-10-CM

## 2013-12-28 ENCOUNTER — Encounter (HOSPITAL_BASED_OUTPATIENT_CLINIC_OR_DEPARTMENT_OTHER): Payer: Self-pay | Admitting: *Deleted

## 2013-12-28 ENCOUNTER — Encounter (HOSPITAL_BASED_OUTPATIENT_CLINIC_OR_DEPARTMENT_OTHER)
Admission: RE | Admit: 2013-12-28 | Discharge: 2013-12-28 | Disposition: A | Discharge status: Home / Self Care | Payer: Medicare Other | Source: Ambulatory Visit | Attending: Plastic Surgery | Admitting: Plastic Surgery

## 2013-12-28 DIAGNOSIS — L97319 Non-pressure chronic ulcer of right ankle with unspecified severity: Secondary | ICD-10-CM | POA: Diagnosis present

## 2013-12-28 DIAGNOSIS — I872 Venous insufficiency (chronic) (peripheral): Secondary | ICD-10-CM | POA: Diagnosis not present

## 2013-12-28 DIAGNOSIS — I10 Essential (primary) hypertension: Secondary | ICD-10-CM | POA: Diagnosis not present

## 2013-12-28 DIAGNOSIS — I8390 Asymptomatic varicose veins of unspecified lower extremity: Secondary | ICD-10-CM | POA: Diagnosis not present

## 2013-12-28 DIAGNOSIS — M1990 Unspecified osteoarthritis, unspecified site: Secondary | ICD-10-CM | POA: Diagnosis not present

## 2013-12-28 DIAGNOSIS — L97311 Non-pressure chronic ulcer of right ankle limited to breakdown of skin: Secondary | ICD-10-CM | POA: Diagnosis not present

## 2013-12-28 DIAGNOSIS — I4891 Unspecified atrial fibrillation: Secondary | ICD-10-CM | POA: Diagnosis not present

## 2013-12-28 LAB — BASIC METABOLIC PANEL
Anion gap: 6 (ref 5–15)
BUN: 26 mg/dL — AB (ref 6–23)
CALCIUM: 9.4 mg/dL (ref 8.4–10.5)
CO2: 27 mmol/L (ref 19–32)
CREATININE: 1.2 mg/dL (ref 0.50–1.35)
Chloride: 108 mEq/L (ref 96–112)
GFR, EST AFRICAN AMERICAN: 67 mL/min — AB (ref >90)
GFR, EST NON AFRICAN AMERICAN: 58 mL/min — AB (ref >90)
GLUCOSE: 96 mg/dL (ref 70–99)
Potassium: 4.9 mmol/L (ref 3.5–5.1)
Sodium: 141 mmol/L (ref 135–145)

## 2013-12-28 NOTE — Progress Notes (Signed)
Pt will come in for bmet-ekg done 8/15-sees dr harding for hx af

## 2013-12-29 ENCOUNTER — Other Ambulatory Visit: Payer: Self-pay | Admitting: Plastic Surgery

## 2013-12-29 DIAGNOSIS — L97312 Non-pressure chronic ulcer of right ankle with fat layer exposed: Secondary | ICD-10-CM

## 2013-12-31 ENCOUNTER — Encounter (HOSPITAL_BASED_OUTPATIENT_CLINIC_OR_DEPARTMENT_OTHER): Payer: Self-pay | Admitting: *Deleted

## 2013-12-31 ENCOUNTER — Ambulatory Visit (HOSPITAL_BASED_OUTPATIENT_CLINIC_OR_DEPARTMENT_OTHER): Payer: Medicare Other | Admitting: Anesthesiology

## 2013-12-31 ENCOUNTER — Encounter (HOSPITAL_BASED_OUTPATIENT_CLINIC_OR_DEPARTMENT_OTHER)
Admission: RE | Disposition: A | Discharge status: Home / Self Care | Payer: Self-pay | Source: Ambulatory Visit | Attending: Plastic Surgery

## 2013-12-31 ENCOUNTER — Ambulatory Visit (HOSPITAL_BASED_OUTPATIENT_CLINIC_OR_DEPARTMENT_OTHER)
Admission: RE | Admit: 2013-12-31 | Discharge: 2013-12-31 | Disposition: A | Discharge status: Home / Self Care | Payer: Medicare Other | Source: Ambulatory Visit | Attending: Plastic Surgery | Admitting: Plastic Surgery

## 2013-12-31 DIAGNOSIS — I10 Essential (primary) hypertension: Secondary | ICD-10-CM | POA: Insufficient documentation

## 2013-12-31 DIAGNOSIS — L97319 Non-pressure chronic ulcer of right ankle with unspecified severity: Secondary | ICD-10-CM | POA: Diagnosis not present

## 2013-12-31 DIAGNOSIS — I4891 Unspecified atrial fibrillation: Secondary | ICD-10-CM | POA: Insufficient documentation

## 2013-12-31 DIAGNOSIS — M1990 Unspecified osteoarthritis, unspecified site: Secondary | ICD-10-CM | POA: Insufficient documentation

## 2013-12-31 DIAGNOSIS — I872 Venous insufficiency (chronic) (peripheral): Secondary | ICD-10-CM | POA: Insufficient documentation

## 2013-12-31 DIAGNOSIS — L97312 Non-pressure chronic ulcer of right ankle with fat layer exposed: Secondary | ICD-10-CM

## 2013-12-31 HISTORY — PX: I&D EXTREMITY: SHX5045

## 2013-12-31 HISTORY — DX: Presence of external hearing-aid: Z97.4

## 2013-12-31 HISTORY — PX: APPLICATION OF A-CELL OF EXTREMITY: SHX6303

## 2013-12-31 HISTORY — DX: Presence of dental prosthetic device (complete) (partial): Z97.2

## 2013-12-31 HISTORY — DX: Presence of spectacles and contact lenses: Z97.3

## 2013-12-31 HISTORY — DX: Unspecified osteoarthritis, unspecified site: M19.90

## 2013-12-31 LAB — POCT HEMOGLOBIN-HEMACUE: Hemoglobin: 13 g/dL (ref 13.0–17.0)

## 2013-12-31 SURGERY — IRRIGATION AND DEBRIDEMENT EXTREMITY
Anesthesia: General | Site: Ankle | Laterality: Right

## 2013-12-31 MED ORDER — CEFAZOLIN SODIUM-DEXTROSE 2-3 GM-% IV SOLR
INTRAVENOUS | Status: AC
  Filled 2013-12-31: qty 50

## 2013-12-31 MED ORDER — POLYMYXIN B SULFATE 500000 UNITS IJ SOLR
INTRAMUSCULAR | Status: DC | PRN
  Administered 2013-12-31: 500 mL

## 2013-12-31 MED ORDER — PROPOFOL 10 MG/ML IV BOLUS
INTRAVENOUS | Status: DC | PRN
  Administered 2013-12-31: 170 mg via INTRAVENOUS

## 2013-12-31 MED ORDER — LACTATED RINGERS IV SOLN
INTRAVENOUS | Status: DC
  Administered 2013-12-31 (×2): via INTRAVENOUS

## 2013-12-31 MED ORDER — MIDAZOLAM HCL 2 MG/2ML IJ SOLN: 1.0000 mg | INTRAMUSCULAR | Status: DC | PRN

## 2013-12-31 MED ORDER — FENTANYL CITRATE 0.05 MG/ML IJ SOLN: 50.0000 ug | INTRAMUSCULAR | Status: DC | PRN

## 2013-12-31 MED ORDER — ONDANSETRON HCL 4 MG/2ML IJ SOLN: 4.0000 mg | Freq: Once | INTRAMUSCULAR | Status: DC | PRN

## 2013-12-31 MED ORDER — SODIUM CHLORIDE 0.9 % IR SOLN: Status: DC | PRN

## 2013-12-31 MED ORDER — FENTANYL CITRATE 0.05 MG/ML IJ SOLN
INTRAMUSCULAR | Status: AC
  Filled 2013-12-31: qty 4

## 2013-12-31 MED ORDER — GLYCOPYRROLATE 0.2 MG/ML IJ SOLN
INTRAMUSCULAR | Status: DC | PRN
  Administered 2013-12-31: 0.2 mg via INTRAVENOUS

## 2013-12-31 MED ORDER — CEFAZOLIN SODIUM-DEXTROSE 2-3 GM-% IV SOLR
2.0000 g | INTRAVENOUS | Status: AC
  Administered 2013-12-31: 2 g via INTRAVENOUS

## 2013-12-31 MED ORDER — 0.9 % SODIUM CHLORIDE (POUR BTL) OPTIME
TOPICAL | Status: DC | PRN
  Administered 2013-12-31: 500 mL

## 2013-12-31 MED ORDER — FENTANYL CITRATE 0.05 MG/ML IJ SOLN
INTRAMUSCULAR | Status: DC | PRN
  Administered 2013-12-31 (×2): 50 ug via INTRAVENOUS

## 2013-12-31 MED ORDER — FENTANYL CITRATE 0.05 MG/ML IJ SOLN: 25.0000 ug | INTRAMUSCULAR | Status: DC | PRN

## 2013-12-31 MED ORDER — EPHEDRINE SULFATE 50 MG/ML IJ SOLN
INTRAMUSCULAR | Status: DC | PRN
  Administered 2013-12-31: 10 mg via INTRAVENOUS

## 2013-12-31 MED ORDER — LIDOCAINE HCL (CARDIAC) 20 MG/ML IV SOLN
INTRAVENOUS | Status: DC | PRN
  Administered 2013-12-31: 60 mg via INTRAVENOUS

## 2013-12-31 MED ORDER — DEXAMETHASONE SODIUM PHOSPHATE 4 MG/ML IJ SOLN
INTRAMUSCULAR | Status: DC | PRN
  Administered 2013-12-31: 10 mg via INTRAVENOUS

## 2013-12-31 SURGICAL SUPPLY — 86 items
BAG DECANTER FOR FLEXI CONT (MISCELLANEOUS) ×2 IMPLANT
BANDAGE ELASTIC 3 VELCRO ST LF (GAUZE/BANDAGES/DRESSINGS) IMPLANT
BANDAGE ELASTIC 4 VELCRO ST LF (GAUZE/BANDAGES/DRESSINGS) ×2 IMPLANT
BANDAGE ELASTIC 6 VELCRO ST LF (GAUZE/BANDAGES/DRESSINGS) IMPLANT
BENZOIN TINCTURE PRP APPL 2/3 (GAUZE/BANDAGES/DRESSINGS) IMPLANT
BLADE HEX COATED 2.75 (ELECTRODE) ×2 IMPLANT
BLADE MINI RND TIP GREEN BEAV (BLADE) IMPLANT
BLADE SURG 10 STRL SS (BLADE) ×2 IMPLANT
BLADE SURG 15 STRL LF DISP TIS (BLADE) ×1 IMPLANT
BLADE SURG 15 STRL SS (BLADE) ×1
BNDG COHESIVE 1X5 TAN STRL LF (GAUZE/BANDAGES/DRESSINGS) IMPLANT
BNDG COHESIVE 4X5 TAN STRL (GAUZE/BANDAGES/DRESSINGS) IMPLANT
BNDG ESMARK 4X9 LF (GAUZE/BANDAGES/DRESSINGS) IMPLANT
BNDG GAUZE ELAST 4 BULKY (GAUZE/BANDAGES/DRESSINGS) ×2 IMPLANT
CANISTER SUCT 1200ML W/VALVE (MISCELLANEOUS) IMPLANT
CANISTER SUCT 3000ML (MISCELLANEOUS) ×2 IMPLANT
CHLORAPREP W/TINT 26ML (MISCELLANEOUS) IMPLANT
CORDS BIPOLAR (ELECTRODE) IMPLANT
COVER BACK TABLE 60X90IN (DRAPES) ×2 IMPLANT
COVER MAYO STAND STRL (DRAPES) ×4 IMPLANT
DECANTER SPIKE VIAL GLASS SM (MISCELLANEOUS) IMPLANT
DRAIN PENROSE 1/2X12 LTX STRL (WOUND CARE) IMPLANT
DRAPE EXTREMITY T 121X128X90 (DRAPE) ×2 IMPLANT
DRAPE INCISE IOBAN 66X45 STRL (DRAPES) IMPLANT
DRAPE U-SHAPE 76X120 STRL (DRAPES) ×2 IMPLANT
DRSG ADAPTIC 3X8 NADH LF (GAUZE/BANDAGES/DRESSINGS) IMPLANT
DRSG EMULSION OIL 3X3 NADH (GAUZE/BANDAGES/DRESSINGS) ×2 IMPLANT
DRSG PAD ABDOMINAL 8X10 ST (GAUZE/BANDAGES/DRESSINGS) ×2 IMPLANT
ELECT NEEDLE TIP 2.8 STRL (NEEDLE) IMPLANT
ELECT REM PT RETURN 9FT ADLT (ELECTROSURGICAL) ×2
ELECTRODE REM PT RTRN 9FT ADLT (ELECTROSURGICAL) ×1 IMPLANT
GAUZE SPONGE 4X4 12PLY STRL (GAUZE/BANDAGES/DRESSINGS) ×2 IMPLANT
GAUZE XEROFORM 1X8 LF (GAUZE/BANDAGES/DRESSINGS) IMPLANT
GAUZE XEROFORM 5X9 LF (GAUZE/BANDAGES/DRESSINGS) IMPLANT
GLOVE BIO SURGEON STRL SZ 6.5 (GLOVE) ×4 IMPLANT
GLOVE BIOGEL M STRL SZ7.5 (GLOVE) ×2 IMPLANT
GLOVE BIOGEL PI IND STRL 8 (GLOVE) ×1 IMPLANT
GLOVE BIOGEL PI INDICATOR 8 (GLOVE) ×1
GOWN STRL REUS W/ TWL LRG LVL3 (GOWN DISPOSABLE) ×2 IMPLANT
GOWN STRL REUS W/TWL LRG LVL3 (GOWN DISPOSABLE) ×2
IV NS IRRIG 3000ML ARTHROMATIC (IV SOLUTION) IMPLANT
MANIFOLD NEPTUNE II (INSTRUMENTS) IMPLANT
MATRIX SURGICAL PSM 7X10CM (Tissue) ×2 IMPLANT
MICROMATRIX 500MG (Tissue) ×2 IMPLANT
NEEDLE HYPO 30GX1 BEV (NEEDLE) IMPLANT
NEEDLE PRECISIONGLIDE 27X1.5 (NEEDLE) IMPLANT
NS IRRIG 1000ML POUR BTL (IV SOLUTION) ×2 IMPLANT
PACK BASIN DAY SURGERY FS (CUSTOM PROCEDURE TRAY) ×2 IMPLANT
PADDING CAST ABS 3INX4YD NS (CAST SUPPLIES)
PADDING CAST ABS 4INX4YD NS (CAST SUPPLIES)
PADDING CAST ABS COTTON 3X4 (CAST SUPPLIES) IMPLANT
PADDING CAST ABS COTTON 4X4 ST (CAST SUPPLIES) IMPLANT
PENCIL BUTTON HOLSTER BLD 10FT (ELECTRODE) ×2 IMPLANT
SHEET MEDIUM DRAPE 40X70 STRL (DRAPES) IMPLANT
SLEEVE SCD COMPRESS KNEE MED (MISCELLANEOUS) ×2 IMPLANT
SOLUTION PARTIC MCRMTRX 500MG (Tissue) ×1 IMPLANT
SPLINT PLASTER CAST XFAST 3X15 (CAST SUPPLIES) IMPLANT
SPLINT PLASTER XTRA FASTSET 3X (CAST SUPPLIES)
SPONGE GAUZE 4X4 12PLY STER LF (GAUZE/BANDAGES/DRESSINGS) IMPLANT
SPONGE LAP 18X18 X RAY DECT (DISPOSABLE) ×2 IMPLANT
SPONGE LAP 4X18 X RAY DECT (DISPOSABLE) IMPLANT
STAPLER VISISTAT 35W (STAPLE) IMPLANT
STOCKINETTE 4X48 STRL (DRAPES) IMPLANT
STOCKINETTE 6  STRL (DRAPES) ×1
STOCKINETTE 6 STRL (DRAPES) ×1 IMPLANT
STOCKINETTE IMPERVIOUS LG (DRAPES) IMPLANT
STRIP CLOSURE SKIN 1/2X4 (GAUZE/BANDAGES/DRESSINGS) IMPLANT
SUCTION FRAZIER TIP 10 FR DISP (SUCTIONS) IMPLANT
SURGILUBE 2OZ TUBE FLIPTOP (MISCELLANEOUS) ×2 IMPLANT
SUT ETHILON 3 0 PS 1 (SUTURE) IMPLANT
SUT ETHILON 4 0 P 3 18 (SUTURE) IMPLANT
SUT ETHILON 5 0 PS 2 18 (SUTURE) IMPLANT
SUT PROLENE 3 0 PS 2 (SUTURE) IMPLANT
SUT SILK 3 0 PS 1 (SUTURE) IMPLANT
SUT VIC AB 3-0 FS2 27 (SUTURE) IMPLANT
SUT VIC AB 5-0 P-3 18X BRD (SUTURE) IMPLANT
SUT VIC AB 5-0 P3 18 (SUTURE)
SUT VIC AB 5-0 PS2 18 (SUTURE) ×2 IMPLANT
SYR BULB IRRIGATION 50ML (SYRINGE) IMPLANT
SYR CONTROL 10ML LL (SYRINGE) IMPLANT
TAPE HYPAFIX 6X30 (GAUZE/BANDAGES/DRESSINGS) IMPLANT
TOWEL OR 17X24 6PK STRL BLUE (TOWEL DISPOSABLE) ×2 IMPLANT
TRAY DSU PREP LF (CUSTOM PROCEDURE TRAY) ×2 IMPLANT
TUBE CONNECTING 20X1/4 (TUBING) ×2 IMPLANT
UNDERPAD 30X30 INCONTINENT (UNDERPADS AND DIAPERS) ×2 IMPLANT
YANKAUER SUCT BULB TIP NO VENT (SUCTIONS) ×2 IMPLANT

## 2013-12-31 NOTE — Op Note (Signed)
Operative Note   DATE OF OPERATION: 12/31/2013  LOCATION: [facility]  SURGICAL DIVISION: Plastic Surgery  PREOPERATIVE DIAGNOSES: right medial ankle ulcer  POSTOPERATIVE DIAGNOSES:  same  PROCEDURE:  Preparation of right medial ankle ulcer (3 x 3 x 1 cm) for placement of Acell (500 mg powder and 7 x 10 cm sheet) after debridement of skin and subcutaneous tissue.  SURGEON: Claire Sanger, DO.  ANESTHESIA:  General.   COMPLICATIONS: None.   INDICATIONS FOR PROCEDURE:  The patient, Joseph Landry is a 73 y.o. male born on 1940/08/02, is here for treatment of right ankle ulcer. MRN: [id number]  CONSENT:  Informed consent was obtained directly from the patient. Risks, benefits and alternatives were fully discussed. Specific risks including but not limited to bleeding, infection, hematoma, seroma, scarring, pain, infection, contracture, asymmetry, wound healing problems, and need for further surgery were all discussed. The patient did have an ample opportunity to have questions answered to satisfaction.   DESCRIPTION OF PROCEDURE:  The patient was taken to the operating room. SCDs were placed and IV antibiotics were given. The patient's operative site was prepped and draped in a sterile fashion. A time out was performed and all information was confirmed to be correct.  General anesthesia was administered.  The #15 blade was used to debridement the area of nonviable skin and subcutaneous tissue.  The ulcer (3 x 3 x 1 cm)was irrigated with antibiotic solution.  The Acell powder and sheet were applied and secured with 5-0 vicryl.  The Adaptic was secured with Vicryl.  KY gel was placed with 4 x 4 gauze, kerlex and an Ace wrap.  The patient tolerated the procedure well.  There were no complications. The patient was allowed to wake from anesthesia, extubated and taken to the recovery room in satisfactory condition.

## 2013-12-31 NOTE — Anesthesia Preprocedure Evaluation (Signed)
Anesthesia Evaluation  Patient identified by MRN, date of birth, ID band Patient awake    Reviewed: Allergy & Precautions, H&P , NPO status , Patient's Chart, lab work & pertinent test results  Airway Mallampati: II   Neck ROM: full    Dental   Pulmonary neg pulmonary ROS,          Cardiovascular hypertension, + dysrhythmias Atrial Fibrillation     Neuro/Psych    GI/Hepatic   Endo/Other    Renal/GU      Musculoskeletal  (+) Arthritis -,   Abdominal   Peds  Hematology   Anesthesia Other Findings   Reproductive/Obstetrics                             Anesthesia Physical Anesthesia Plan  ASA: II  Anesthesia Plan: General   Post-op Pain Management:    Induction: Intravenous  Airway Management Planned: LMA  Additional Equipment:   Intra-op Plan:   Post-operative Plan:   Informed Consent: I have reviewed the patients History and Physical, chart, labs and discussed the procedure including the risks, benefits and alternatives for the proposed anesthesia with the patient or authorized representative who has indicated his/her understanding and acceptance.     Plan Discussed with: CRNA, Anesthesiologist and Surgeon  Anesthesia Plan Comments:         Anesthesia Quick Evaluation

## 2013-12-31 NOTE — Anesthesia Postprocedure Evaluation (Signed)
Anesthesia Post Note  Patient: Joseph Landry  Procedure(s) Performed: Procedure(s) (LRB): IRRIGATION AND DEBRIDEMENT EXTREMITY OF RIGHT ANKLE WOUND (Right) PLACEMENT OF ACELL RIGHT MEDIAL ANKLE (Right)  Anesthesia type: General  Patient location: PACU  Post pain: Pain level controlled and Adequate analgesia  Post assessment: Post-op Vital signs reviewed, Patient's Cardiovascular Status Stable, Respiratory Function Stable, Patent Airway and Pain level controlled  Last Vitals:  Filed Vitals:   12/31/13 1500  BP: 142/67  Pulse: 58  Temp:   Resp: 12    Post vital signs: Reviewed and stable  Level of consciousness: awake, alert  and oriented  Complications: No apparent anesthesia complications

## 2013-12-31 NOTE — Brief Op Note (Signed)
12/31/2013  2:26 PM  PATIENT:  Joseph Landry  73 y.o. male  PRE-OPERATIVE DIAGNOSIS:  ULCER OF RIGHT ANKLE  POST-OPERATIVE DIAGNOSIS:  * No post-op diagnosis entered *  PROCEDURE:  Procedure(s): IRRIGATION AND DEBRIDEMENT EXTREMITY OF RIGHT ANKLE WOUND WITH PLACEMENT OF ACELL (Right) APPLICATION OF A-CELL RIGHT MEDIAL ANKLE (Right)  SURGEON:  Surgeon(s) and Role:    * Turhan Chill Sanger, DO - Primary  PHYSICIAN ASSISTANT: none  ASSISTANTS: none   ANESTHESIA:   general  EBL:  Total I/O In: 400 [I.V.:400] Out: -   BLOOD ADMINISTERED:none  DRAINS: none   LOCAL MEDICATIONS USED:  NONE  SPECIMEN:  No Specimen  DISPOSITION OF SPECIMEN:  N/A  COUNTS:  YES  TOURNIQUET:    DICTATION: .Dragon Dictation  PLAN OF CARE: Discharge to home after PACU  PATIENT DISPOSITION:  PACU - hemodynamically stable.   Delay start of Pharmacological VTE agent (>24hrs) due to surgical blood loss or risk of bleeding: no

## 2013-12-31 NOTE — Transfer of Care (Signed)
Immediate Anesthesia Transfer of Care Note  Patient: Joseph Landry  Procedure(s) Performed: Procedure(s): IRRIGATION AND DEBRIDEMENT EXTREMITY OF RIGHT ANKLE WOUND WITH PLACEMENT OF ACELL (Right) APPLICATION OF A-CELL RIGHT MEDIAL ANKLE (Right)  Patient Location: PACU  Anesthesia Type:General  Level of Consciousness: sedated  Airway & Oxygen Therapy: Patient Spontanous Breathing and Patient connected to face mask oxygen  Post-op Assessment: Report given to PACU RN and Post -op Vital signs reviewed and stable  Post vital signs: Reviewed and stable  Complications: No apparent anesthesia complications

## 2013-12-31 NOTE — Interval H&P Note (Signed)
History and Physical Interval Note:  12/31/2013 12:27 PM  Joseph Landry  has presented today for surgery, with the diagnosis of ULCER OF RIGHT ANKLE  The various methods of treatment have been discussed with the patient and family. After consideration of risks, benefits and other options for treatment, the patient has consented to  Procedure(s): IRRIGATION AND DEBRIDEMENT EXTREMITY OF RIGHT ANKLE WOUND WITH PLACEMENT OF ACELL (Right) APPLICATION OF A-CELL OF EXTREMITY (Right) as a surgical intervention .  The patient's history has been reviewed, patient examined, no change in status, stable for surgery.  I have reviewed the patient's chart and labs.  Questions were answered to the patient's satisfaction.     SANGER,Markel Mergenthaler

## 2013-12-31 NOTE — Discharge Instructions (Signed)
KY gel to the right ankle daily.   Post Anesthesia Home Care Instructions  Activity: Get plenty of rest for the remainder of the day. A responsible adult should stay with you for 24 hours following the procedure.  For the next 24 hours, DO NOT: -Drive a car -Paediatric nurse -Drink alcoholic beverages -Take any medication unless instructed by your physician -Make any legal decisions or sign important papers.  Meals: Start with liquid foods such as gelatin or soup. Progress to regular foods as tolerated. Avoid greasy, spicy, heavy foods. If nausea and/or vomiting occur, drink only clear liquids until the nausea and/or vomiting subsides. Call your physician if vomiting continues.  Special Instructions/Symptoms: Your throat may feel dry or sore from the anesthesia or the breathing tube placed in your throat during surgery. If this causes discomfort, gargle with warm salt water. The discomfort should disappear within 24 hours.

## 2013-12-31 NOTE — H&P (View-Only) (Signed)
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  He has been using Santyl and collagen. There has been a little improvement but not much.  There is a little bit of granulation tissue at the base.  There has been no change in his medications or social history.  REVIEW OF SYSTEMS:  Otherwise negative.  PHYSICAL EXAMINATION:  GENERAL:  On exam, he is alert, oriented, cooperative, not in any distress.  He is pleasant.  He seems to be a good historian. RESPIRATORY:  His breathing is unlabored. CARDIOVASCULAR:  His heart rate is regular. EXTREMITIES:  He had varicose veins of the lower extremity with some petechiae and __________ from previous ulcers.  ASSESSMENT AND PLAN:  The ulcer is on the medial aspect of his right ankle.  A little bit of debridement was done with a curette, but he has a fibrous base that I am not able to get debrided with the curette here in the office.  He is on Pradaxa.  So, he would bleed, potentially too much here in the office.  So, my recommendation is to do a debridement in the OR, and he is in agreement to that.  We will make those arrangements.  In the meantime, he can continue with the Santyl, collagen, elevation, multivitamin, vitamin C, zinc, and protein; and we will see him next week.     Theodoro Kos, DO     CS/MEDQ  D:  12/07/2013  T:  12/07/2013  Job:  [id number]

## 2014-01-04 ENCOUNTER — Encounter (HOSPITAL_BASED_OUTPATIENT_CLINIC_OR_DEPARTMENT_OTHER): Payer: Self-pay | Admitting: Plastic Surgery

## 2014-01-04 ENCOUNTER — Encounter (HOSPITAL_BASED_OUTPATIENT_CLINIC_OR_DEPARTMENT_OTHER): Payer: Medicare Other | Attending: Plastic Surgery

## 2014-01-04 DIAGNOSIS — L97319 Non-pressure chronic ulcer of right ankle with unspecified severity: Secondary | ICD-10-CM | POA: Insufficient documentation

## 2014-01-04 DIAGNOSIS — I872 Venous insufficiency (chronic) (peripheral): Secondary | ICD-10-CM | POA: Insufficient documentation

## 2014-01-05 NOTE — Progress Notes (Signed)
[facility]  NAME:  Joseph Landry, Joseph Landry NO.:  [id number]  MEDICAL RECORD NO.:  [id number]      DATE OF BIRTH:  07-09-1940  PHYSICIAN:  Theodoro Kos, DO            VISIT DATE:01/04/2014                                  OFFICE VISIT   The patient is a 74 year old gentleman who is here for a followup after undergoing debridement.  He is overall doing extremely well.  The Adaptic is in place.  He is to continue with the Hydrogel on the area once a day.  I will see him back in a week.     Theodoro Kos, DO     CS/MEDQ  D:  01/04/2014  T:  01/05/2014  Job:  [id number]

## 2014-01-11 ENCOUNTER — Encounter (HOSPITAL_BASED_OUTPATIENT_CLINIC_OR_DEPARTMENT_OTHER): Payer: Medicare Other

## 2014-01-11 DIAGNOSIS — I872 Venous insufficiency (chronic) (peripheral): Secondary | ICD-10-CM | POA: Diagnosis not present

## 2014-01-11 DIAGNOSIS — L97319 Non-pressure chronic ulcer of right ankle with unspecified severity: Secondary | ICD-10-CM | POA: Diagnosis not present

## 2014-01-13 ENCOUNTER — Encounter: Payer: Self-pay | Admitting: Vascular Surgery

## 2014-01-14 ENCOUNTER — Telehealth: Payer: Self-pay | Admitting: Cardiology

## 2014-01-14 ENCOUNTER — Encounter: Payer: Self-pay | Admitting: Vascular Surgery

## 2014-01-14 ENCOUNTER — Ambulatory Visit (INDEPENDENT_AMBULATORY_CARE_PROVIDER_SITE_OTHER): Payer: Medicare Other | Admitting: Vascular Surgery

## 2014-01-14 VITALS — BP 177/79 | HR 48 | Resp 16 | Ht 71.0 in | Wt 183.0 lb

## 2014-01-14 DIAGNOSIS — I83013 Varicose veins of right lower extremity with ulcer of ankle: Secondary | ICD-10-CM | POA: Insufficient documentation

## 2014-01-14 DIAGNOSIS — L97319 Non-pressure chronic ulcer of right ankle with unspecified severity: Principal | ICD-10-CM

## 2014-01-14 HISTORY — PX: ENDOVENOUS ABLATION SAPHENOUS VEIN W/ LASER: SUR449

## 2014-01-14 NOTE — Telephone Encounter (Signed)
Pt has been off Pradaxa since Sunday in advance of vein procedure today. Was instructed to take 600mg  Ibuprofen TID to help w/ healing.  Wife wants to know when he should resume Pradaxa and whether or not to take the Ibuprofen.  Noted there is a potential increase risk of GI bleed w/ ibuprofen & certain anticoags.  Pt was given instruction to f/u w/ Dr. Ellyn Hack w/ questions.

## 2014-01-14 NOTE — Telephone Encounter (Signed)
Pt had vein surgery on his right leg this morning. Dr Renda Rolls told him to take Ibuprosen 600 mg 3 times a day. Will this be all right since he is on Pradaxa?

## 2014-01-14 NOTE — Telephone Encounter (Signed)
Spoke w/ Dr. Ellyn Hack. Informed wife of Dr. Allison Quarry instructions. She voiced understanding.

## 2014-01-14 NOTE — Progress Notes (Signed)
   Laser Ablation Procedure    Date: 01/14/2014    Joseph Landry DOB:Nov 13, 1940  Consent signed: Yes  SurgeonT.F. Brantlee Penn  Procedure: Laser Ablation: right Greater Saphenous Vein  There were no vitals taken for this visit.  Start:1040    End time: 1120  Tumescent Anesthesia: 450 cc 0.9% NaCl with 50 cc Lidocaine HCL with 1% Epi and 15 cc 8.4% NaHCO3  Local Anesthesia: 2 cc Lidocaine HCL and NaHCO3 (ratio 2:1)  Pulsed mode:15 watts, 594ms delay, 1.0 duration and Total energy: 1871, Total pulses: 1, Total time: 2:04     Patient tolerated procedure well: Yes  Notes: Uneventful ablation from distal thigh to just below saphenofemoral junction  Description of Procedure:  After marking the course of the secondary varicosities, the patient was placed on the operating table in the supine position, and the right leg was prepped and draped in sterile fashion.   Local anesthetic was administered and under ultrasound guidance the saphenous vein was accessed with a micro needle and guide wire; then the mirco puncture sheath was place.  A guide wire was inserted saphenofemoral junction , followed by a 5 french sheath.  The position of the sheath and then the laser fiber below the junction was confirmed using the ultrasound.  Tumescent anesthesia was administered along the course of the saphenous vein using ultrasound guidance. The patient was placed in Trendelenburg position and protective laser glasses were placed on patient and staff, and the laser was fired at at 15 watt continuous mode for a total of 1871 joules.    Steri strips were applied to the stab wounds and ABD pads and thigh high compression stockings were applied.  Ace wrap bandages were applied over the phlebectomy sites and at the top of the saphenofemoral junction. Blood loss was less than 15 cc.  The patient ambulated out of the operating room having tolerated the procedure well.

## 2014-01-14 NOTE — Telephone Encounter (Signed)
HOLD while on Ibuprofen.  Joseph Landry

## 2014-01-15 ENCOUNTER — Telehealth: Payer: Self-pay | Admitting: *Deleted

## 2014-01-15 NOTE — Telephone Encounter (Signed)
    01/15/2014  Time: 11:05 AM   Patient Name: Joseph Landry  Patient of: T.F. Early  Procedure:Laser Ablation right greater saphenous vein 01-14-2014    Reached patient at home and checked  His status  Yes    Comments/Actions Taken: Joseph Landry reports no right leg pain or swelling today.  Joseph Landry reports that he spoke with his cardiologist who cleared him to not take Pradaxa for 1 week while he is taking Ibuprofen (three times daily)  post laser ablation.  Reviewed post procedural instructions with Joseph Landry and reminded him of post laser ablation duplex and VV FU with Dr. Donnetta Landry on 01-26-2014.      @SIGNATURE @

## 2014-01-18 DIAGNOSIS — I872 Venous insufficiency (chronic) (peripheral): Secondary | ICD-10-CM | POA: Diagnosis not present

## 2014-01-18 DIAGNOSIS — L97319 Non-pressure chronic ulcer of right ankle with unspecified severity: Secondary | ICD-10-CM | POA: Diagnosis not present

## 2014-01-19 NOTE — Progress Notes (Signed)
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  He had surgical interventions of his vein last week and it seems to be helping. There is no change in his medications or social history.  He has been instructed to wear a compression stocking to his thigh which he has been doing, and he is doing [employer] every other day.  PHYSICAL EXAMINATION:  On exam, he is alert, oriented, cooperative, not in any distress.  He is very pleasant.  His breathing is unlabored.  His heart rate is regular.  The pulse is present.  There is no sign of infection.  The ACell still in place.  So, we will continue with the KY jelly with Adaptic gauze and the compression stocking every other day and we will see him back in 1 week.     Theodoro Kos, DO     CS/MEDQ  D:  01/18/2014  T:  01/19/2014  Job:  [id number]

## 2014-01-25 ENCOUNTER — Encounter: Payer: Self-pay | Admitting: Vascular Surgery

## 2014-01-25 DIAGNOSIS — L97319 Non-pressure chronic ulcer of right ankle with unspecified severity: Secondary | ICD-10-CM | POA: Diagnosis not present

## 2014-01-25 DIAGNOSIS — I872 Venous insufficiency (chronic) (peripheral): Secondary | ICD-10-CM | POA: Diagnosis not present

## 2014-01-26 ENCOUNTER — Ambulatory Visit (HOSPITAL_COMMUNITY)
Admission: RE | Admit: 2014-01-26 | Discharge: 2014-01-26 | Disposition: A | Discharge status: Home / Self Care | Payer: Medicare Other | Source: Ambulatory Visit | Attending: Vascular Surgery | Admitting: Vascular Surgery

## 2014-01-26 ENCOUNTER — Ambulatory Visit (INDEPENDENT_AMBULATORY_CARE_PROVIDER_SITE_OTHER): Payer: Medicare Other | Admitting: Vascular Surgery

## 2014-01-26 ENCOUNTER — Encounter: Payer: Self-pay | Admitting: Vascular Surgery

## 2014-01-26 VITALS — BP 178/77 | HR 49 | Resp 16 | Ht 71.0 in | Wt 187.6 lb

## 2014-01-26 DIAGNOSIS — L97319 Non-pressure chronic ulcer of right ankle with unspecified severity: Secondary | ICD-10-CM

## 2014-01-26 DIAGNOSIS — I83013 Varicose veins of right lower extremity with ulcer of ankle: Secondary | ICD-10-CM | POA: Insufficient documentation

## 2014-01-26 NOTE — Progress Notes (Signed)
Here today for follow-up of laser ablation of right great saphenous vein on 01/14/2014. He did extremely well following the procedure. He reports no significant postoperative discomfort. He had minimal bruising. Does have some irritation related to the stocking. He does have a venous ulcer which is been quite persistent. He is having ongoing treatment at the wound center regarding this.  Past Medical History  Diagnosis Date  . PAF (paroxysmal atrial fibrillation)     Rate controlled with beta blocker; rhythm control with flecainide; panic regulation with Pradaxa  . Dyslipidemia      on  simvastatin   . Hypertension     Labile; often elevated in clinic visits, but never at home  . Wears glasses   . Wears partial dentures   . Arthritis   . Wears hearing aid     both ears  . Varicose veins   . Ulcer of ankle     right ankle    History  Substance Use Topics  . Smoking status: Never Smoker   . Smokeless tobacco: Never Used  . Alcohol Use: 0.0 oz/week    0 Not specified per week     Comment: Only on occasion    Family History  Problem Relation Age of Onset  . Stroke Mother 56  . Heart disease Mother     Allergies  Allergen Reactions  . Multaq [Dronedarone] Nausea Only    Significant bradycardia     Current outpatient prescriptions:  .  aspirin 81 MG tablet, Take 81 mg by mouth daily., Disp: , Rfl:  .  betamethasone valerate ointment (VALISONE) 0.1 %, Apply 1 application topically daily., Disp: , Rfl: 1 .  dabigatran (PRADAXA) 150 MG CAPS capsule, Take 150 mg by mouth 2 (two) times daily., Disp: , Rfl:  .  flecainide (TAMBOCOR) 100 MG tablet, Take 0.5 tablets (50 mg total) by mouth every 12 (twelve) hours., Disp: 30 tablet, Rfl: 6 .  folic acid (FOLVITE) 1 MG tablet, Take 400 mg by mouth daily. , Disp: , Rfl:  .  Iron-FA-B Cmp-C-Biot-Probiotic (FUSION PLUS PO), Take by mouth., Disp: , Rfl:  .  metoprolol succinate (TOPROL-XL) 100 MG 24 hr tablet, Take 1 tablet by mouth  daily., Disp: , Rfl: 0 .  Omega-3 Fatty Acids (FISH OIL) 1000 MG CAPS, Take by mouth., Disp: , Rfl:  .  simvastatin (ZOCOR) 40 MG tablet, Take 40 mg by mouth every evening., Disp: , Rfl:  .  triamterene-hydrochlorothiazide (DYAZIDE) 37.5-25 MG per capsule, Take 1 capsule by mouth daily., Disp: , Rfl:  .  vitamin A 8000 UNIT capsule, Take 8,000 Units by mouth daily., Disp: , Rfl:  .  vitamin C (ASCORBIC ACID) 500 MG tablet, Take 500 mg by mouth daily., Disp: , Rfl:  .  vitamin E (VITAMIN E) 400 UNIT capsule, Take 400 Units by mouth daily., Disp: , Rfl:   BP 178/77 mmHg  Pulse 49  Resp 16  Ht 5\' 11"  (1.803 m)  Wt 187 lb 9.6 oz (85.095 kg)  BMI 26.18 kg/m2  Body mass index is 26.18 kg/(m^2).       Physical exam is well-developed well-nourished, no acute distress No bruising noted in his right thigh. He does have some thickening over the area of the saphenous vein ablation Has approximately 2 cm a venous ulcer at the medial malleolus.  Venous duplex was reviewed with the patient from today. This does show successful ablation from the distal insertion site and his knee up to within 1  cm of the saphenofemoral junction and no evidence of DVT.  Impression and plan successful laser ablation of right great saphenous vein for correction of venous hypertension. He will continue the wound center to obtain healing of his venous ulcer. He does have palpable dorsalis pedis pulse with no evidence of arterial insufficiency. He will see [place] again on as-needed basis

## 2014-01-26 NOTE — Progress Notes (Signed)
[facility]  NAME:  Joseph Landry, Joseph Landry NO.:  [id number]  MEDICAL RECORD NO.:  [id number]      DATE OF BIRTH:  [birth year]  PHYSICIAN:  Theodoro Kos, DO       VISIT DATE:  01/25/2014                                  OFFICE VISIT   The patient is a 74 year old male who is here for followup on his right lower extremity medial ankle ulcer.  He underwent debridement with ACell placement.  The ACell is still in place.  There is no sign of infection. The redness has improved.  The swelling has improved.  He also had a vascular surgery done, so this may be helping as well.  He has an appointment tomorrow for followup on that.  He has been using K-Y Jelly. Once a day.  He is breathing is unlabored.  His heart rate is regular. No sign of infection.  Adaptic were changing every other day and putting K-Y Jelly.  He will continue with that, and we will see him back in 1 week.     Theodoro Kos, DO     CS/MEDQ  D:  01/25/2014  T:  01/26/2014  Job:  [id number]

## 2014-02-01 ENCOUNTER — Encounter (HOSPITAL_BASED_OUTPATIENT_CLINIC_OR_DEPARTMENT_OTHER): Payer: Medicare Other | Attending: Plastic Surgery

## 2014-02-01 DIAGNOSIS — L97319 Non-pressure chronic ulcer of right ankle with unspecified severity: Secondary | ICD-10-CM | POA: Insufficient documentation

## 2014-02-01 DIAGNOSIS — I872 Venous insufficiency (chronic) (peripheral): Secondary | ICD-10-CM | POA: Diagnosis present

## 2014-02-15 DIAGNOSIS — I872 Venous insufficiency (chronic) (peripheral): Secondary | ICD-10-CM | POA: Diagnosis not present

## 2014-02-15 DIAGNOSIS — L97319 Non-pressure chronic ulcer of right ankle with unspecified severity: Secondary | ICD-10-CM | POA: Diagnosis not present

## 2014-02-16 NOTE — Progress Notes (Signed)
[facility]  NAME:  DUC, REISCHMAN                ACCOUNT NO.:  [id number]  MEDICAL RECORD NO.:  [id number]      DATE OF BIRTH:  1940-02-28  PHYSICIAN:  Theodoro Kos, DO       VISIT DATE:  02/15/2014                                  OFFICE VISIT   The patient is a 74 year old male who is here for followup on his right lower extremity medial ulcer.  He is doing well with the ACell in place that we put on several weeks ago.  There is no sign of infection.  There is no change in medications or social history.  He is alert and oriented, cooperative, not in any distress.  He is very pleasant.  He has been using KY jelly on the area daily.  It is still in place.  It looks like the undersurface is incorporating but the sheet is still there, so we will continue with the KY jelly and have him follow up in 2 weeks.  In the meantime, he is to continue with multivitamin, elevation, and KY daily.     Theodoro Kos, DO     CS/MEDQ  D:  02/15/2014  T:  02/16/2014  Job:  [id number]

## 2014-02-25 DIAGNOSIS — I872 Venous insufficiency (chronic) (peripheral): Secondary | ICD-10-CM | POA: Diagnosis not present

## 2014-02-25 DIAGNOSIS — L97319 Non-pressure chronic ulcer of right ankle with unspecified severity: Secondary | ICD-10-CM | POA: Diagnosis not present

## 2014-03-01 ENCOUNTER — Encounter: Payer: Self-pay | Admitting: Cardiology

## 2014-03-01 ENCOUNTER — Ambulatory Visit (INDEPENDENT_AMBULATORY_CARE_PROVIDER_SITE_OTHER): Payer: Medicare Other | Admitting: Cardiology

## 2014-03-01 VITALS — BP 112/70 | HR 52 | Ht 71.0 in | Wt 189.6 lb

## 2014-03-01 DIAGNOSIS — E785 Hyperlipidemia, unspecified: Secondary | ICD-10-CM

## 2014-03-01 DIAGNOSIS — L97309 Non-pressure chronic ulcer of unspecified ankle with unspecified severity: Secondary | ICD-10-CM

## 2014-03-01 DIAGNOSIS — L97319 Non-pressure chronic ulcer of right ankle with unspecified severity: Secondary | ICD-10-CM

## 2014-03-01 DIAGNOSIS — I83013 Varicose veins of right lower extremity with ulcer of ankle: Secondary | ICD-10-CM

## 2014-03-01 DIAGNOSIS — I10 Essential (primary) hypertension: Secondary | ICD-10-CM

## 2014-03-01 DIAGNOSIS — I480 Paroxysmal atrial fibrillation: Secondary | ICD-10-CM

## 2014-03-01 DIAGNOSIS — Z8719 Personal history of other diseases of the digestive system: Secondary | ICD-10-CM

## 2014-03-01 NOTE — Patient Instructions (Signed)
NO CHANGE IN MEDICATION   Your physician wants you to follow-up in 6 MONTH  DR [place]   You will receive a reminder letter in the mail two months in advance. If you don't receive a letter, please call our office to schedule the follow-up appointment.

## 2014-03-03 ENCOUNTER — Encounter: Payer: Self-pay | Admitting: Cardiology

## 2014-03-03 NOTE — Progress Notes (Signed)
PCP: Dwan Bolt, MD  Clinic Note: Chief Complaint  Patient presents with  . 6 MONTH VISIT    NO CHEST PAIN , NO SOB , NO EDEMA, RIGHT  LEG UCLER IS HEALING, NO IRREGULAR HEART BEAT NOTED   HPI: Joseph Landry is a 74 y.o. male with a Cardiovascular Problem List below who presents today for six-month followup. I actually saw him in August of last year prior to his trip to [place]. He enjoys daily but had some pretty bad swelling in his ankles and end up developing an ulcer. He was seen by Audry Riles back in November 2015 for evaluation of his ulcer. He had lower extremity arterial and venous Dopplers performed after discussing with Dr. Gwenlyn Found. Arterial perfusion was stable, however he had evidence of venous reflux. He was referred to Dr. Curt Jews, who performed laser ablation of right greater saphenous vein in January of 2016. He did well with the procedure and has been wearing his compression stockings now. He is having wound care treat his ulcer and notes it has improved some.  Otherwise cardiac standpoint, he is following up for his hypertension and paroxysmal atrial fibrillation treated with metoprolol for rate control and has been on Pradaxa for anticoagulation.  Interval History: from cardiac standpoint Joseph Landry is doing quite well. He is not yet back up walking around much he usually did because of the ulcer. He is put on some weight as a result has got frustrated by that. He really otherwise denies PND orthopnea normal his lower extremity edema. No chest tightness or pressure with rest or exertion. He has not had any sense of recurring A. Fib. No rapid /irregular heart beats/limits. No syncope/near syncope or TIA/amaurosis fugax.  He restarted taking his Pradaxa once this hemoglobin levels improved.he currently denies any melena hematochezia or hematuria.  Past Medical History  Diagnosis Date  . PAF (paroxysmal atrial fibrillation)     Rate controlled with beta blocker; rhythm  control with flecainide; panic regulation with Pradaxa  . Dyslipidemia      on  simvastatin   . Essential hypertension     Labile; often elevated in clinic visits, but never at home  . Wears glasses   . Wears partial dentures   . Arthritis   . Wears hearing aid     both ears  . Varicose veins     s/p R GSFA Endovenous Ablation  . Ulcer of ankle     right ankle; s/p I&D    Prior Cardiac Evaluation and Past Surgical History: Past Surgical History  Procedure Laterality Date  . Transthoracic echocardiogram  August 2012    Normal LV size and function, normal LA size, EF> 55%.  . Nm myoview ltd - persantine  August 2012    No infarct or ischemia.  . Event monitor  08/22/2011-09/20/2011    shows a fib- startedon FLECAINDE  . Nasal sinus surgery    . Colonoscopy    . I&d extremity Right 12/31/2013    Procedure: IRRIGATION AND DEBRIDEMENT RIGHT ANKLE WOUND;  Surgeon: Theodoro Kos, DO;  Location: [facility];  Service: Plastics;  Laterality: Right;  . Application of a-cell of extremity Right 12/31/2013    Procedure: PLACEMENT OF ACELL RIGHT MEDIAL ANKLE;  Surgeon: Theodoro Kos, DO;  Location: [facility];  Service: Plastics;  Laterality: Right;  . Endovenous ablation saphenous vein w/ laser Right 01-14-2014    EVLA right greater saphenous vein by Curt Jews MD  Current Outpatient Prescriptions on File Prior to Visit  Medication Sig Dispense Refill  . dabigatran (PRADAXA) 150 MG CAPS capsule Take 150 mg by mouth 2 (two) times daily.    . flecainide (TAMBOCOR) 100 MG tablet Take 0.5 tablets (50 mg total) by mouth every 12 (twelve) hours. 30 tablet 6  . folic acid (FOLVITE) 1 MG tablet Take 400 mg by mouth daily.     . Iron-FA-B Cmp-C-Biot-Probiotic (FUSION PLUS PO) Take by mouth.    . metoprolol succinate (TOPROL-XL) 100 MG 24 hr tablet Take 1 tablet by mouth daily.  0  . Omega-3 Fatty Acids (FISH OIL) 1000 MG CAPS Take by mouth.    . simvastatin (ZOCOR) 40  MG tablet Take 40 mg by mouth every evening.    . triamterene-hydrochlorothiazide (DYAZIDE) 37.5-25 MG per capsule Take 1 capsule by mouth daily.    . vitamin A 8000 UNIT capsule Take 8,000 Units by mouth daily.    . vitamin C (ASCORBIC ACID) 500 MG tablet Take 500 mg by mouth daily.    . vitamin E (VITAMIN E) 400 UNIT capsule Take 400 Units by mouth daily.     No current facility-administered medications on file prior to visit.   Allergies  Allergen Reactions  . Multaq [Dronedarone] Nausea Only    Significant bradycardia   No Change in Social and Family History  ROS: A comprehensive Review of Systems was performed Review of Systems  Constitutional: Negative for malaise/fatigue.       Wgt gain due to decreased activity 2/2 ankle ulcer / venous ablation etc.  HENT: Negative for nosebleeds.   Eyes: Negative for blurred vision.  Respiratory: Negative for cough and shortness of breath.   Cardiovascular: Positive for leg swelling (stable).       Otherwise negative per HPI  Gastrointestinal: Negative for blood in stool and melena.  Genitourinary: Negative for hematuria.  Musculoskeletal: Negative for myalgias.  Neurological: Negative.  Negative for dizziness.  Endo/Heme/Allergies: Bruises/bleeds easily.  Psychiatric/Behavioral: Negative.   All other systems reviewed and are negative.    Wt Readings from Last 3 Encounters:  03/01/14 189 lb 9.6 oz (86.002 kg)  01/26/14 187 lb 9.6 oz (85.095 kg)  01/14/14 183 lb (83.008 kg)   PHYSICAL EXAM BP 112/70 mmHg  Pulse 52  Ht 5\' 11"  (1.803 m)  Wt 189 lb 9.6 oz (86.002 kg)  BMI 26.46 kg/m2 General appearance: alert, cooperative, appears stated age, no distress; well-nourished and well-groomed HEENT: [place]/AT, EOMI, MMM, anicteric sclera Neck: no adenopathy, no carotid bruit and no JVD Lungs: clear to auscultation bilaterally, normal percussion bilaterally and non-labored Heart: regular rate and rhythm, S1, S2 normal, no murmur, click, rub  or gallop; nondisplaced PMI Abdomen: soft, non-tender; bowel sounds normal; no masses,  no organomegaly; Extremities: extremities normal, umatic, no cyanosis, trace edema  - R ankle ulcer with dressing in place - C/D/I, no sign of erythema & mildly tender Pulses: 2+ and symmetric; Neurologic: Mental status: Alert, oriented, thought content appropriate; grossly normal   Adult ECG Report  Rate: 52 ;  Rhythm: sinus bradycardia and First degree AV block; otherwise normal EKG; stable EKG   Recent Labs April 2015: No recent labs.  Hgb 12/2103: 13  ASSESSMENT / PLAN: PAF (paroxysmal atrial fibrillation) Sinus bradycardia with stable EKG. Has not had a recurrence of A. Fib in quite some time - on current regimen. No symptoms. Continue rate control with beta blocker and rhythm control with flecainide Anticoagulation with Pradaxa. Renal function appears  stable   Essential hypertension Excellent blood pressure control today. I think his blood pressure during the vascular surgery visits were related to his discomfort.   H/O: GI bleed Stable hemoglobin. Her back on Paxil.   Dyslipidemia Monitored by PCP. I have not seen labs recently. He remains on statin.   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 Appears to be doing well. Continue compression stockings.   Ankle ulcer Wound care continues to follow.  Seems to be improving.     Orders Placed This Encounter  Procedures  . EKG 12-Lead   No orders of the defined types were placed in this encounter.    Followup: Roughly 6 months  DAVID W. Ellyn Hack, M.D., M.S. Interventional Cardiologist [facility]

## 2014-03-03 NOTE — Assessment & Plan Note (Signed)
Sinus bradycardia with stable EKG. Has not had a recurrence of A. Fib in quite some time - on current regimen. No symptoms. Continue rate control with beta blocker and rhythm control with flecainide Anticoagulation with Pradaxa. Renal function appears stable

## 2014-03-03 NOTE — Assessment & Plan Note (Signed)
Excellent blood pressure control today. I think his blood pressure during the vascular surgery visits were related to his discomfort.

## 2014-03-03 NOTE — Assessment & Plan Note (Signed)
Status post endovenous ablation. Appears to be doing well. Continue compression stockings.

## 2014-03-03 NOTE — Assessment & Plan Note (Signed)
Monitored by PCP. I have not seen labs recently. He remains on statin.

## 2014-03-03 NOTE — Assessment & Plan Note (Signed)
Wound care continues to follow.  Seems to be improving.

## 2014-03-03 NOTE — Assessment & Plan Note (Signed)
Stable hemoglobin. Her back on Paxil.

## 2014-03-15 ENCOUNTER — Encounter (HOSPITAL_BASED_OUTPATIENT_CLINIC_OR_DEPARTMENT_OTHER): Payer: Medicare Other | Attending: Plastic Surgery

## 2014-03-15 DIAGNOSIS — I872 Venous insufficiency (chronic) (peripheral): Secondary | ICD-10-CM | POA: Insufficient documentation

## 2014-03-15 DIAGNOSIS — L97311 Non-pressure chronic ulcer of right ankle limited to breakdown of skin: Secondary | ICD-10-CM | POA: Diagnosis present

## 2014-03-29 DIAGNOSIS — I872 Venous insufficiency (chronic) (peripheral): Secondary | ICD-10-CM | POA: Diagnosis not present

## 2014-03-29 DIAGNOSIS — L97311 Non-pressure chronic ulcer of right ankle limited to breakdown of skin: Secondary | ICD-10-CM | POA: Diagnosis not present

## 2014-04-12 ENCOUNTER — Encounter (HOSPITAL_BASED_OUTPATIENT_CLINIC_OR_DEPARTMENT_OTHER): Payer: Medicare Other | Attending: Plastic Surgery

## 2014-04-12 DIAGNOSIS — I872 Venous insufficiency (chronic) (peripheral): Secondary | ICD-10-CM | POA: Insufficient documentation

## 2014-04-12 DIAGNOSIS — L97311 Non-pressure chronic ulcer of right ankle limited to breakdown of skin: Secondary | ICD-10-CM | POA: Diagnosis not present

## 2014-04-26 DIAGNOSIS — I872 Venous insufficiency (chronic) (peripheral): Secondary | ICD-10-CM | POA: Diagnosis not present

## 2014-04-26 DIAGNOSIS — L97311 Non-pressure chronic ulcer of right ankle limited to breakdown of skin: Secondary | ICD-10-CM | POA: Diagnosis not present

## 2014-05-10 ENCOUNTER — Encounter (HOSPITAL_BASED_OUTPATIENT_CLINIC_OR_DEPARTMENT_OTHER): Payer: Medicare Other | Attending: Plastic Surgery

## 2014-05-10 DIAGNOSIS — L97312 Non-pressure chronic ulcer of right ankle with fat layer exposed: Secondary | ICD-10-CM | POA: Diagnosis present

## 2014-07-18 ENCOUNTER — Other Ambulatory Visit: Payer: Self-pay | Admitting: Cardiology

## 2014-07-19 NOTE — Telephone Encounter (Signed)
Rx(s) sent to pharmacy electronically.  

## 2014-08-30 ENCOUNTER — Encounter: Payer: Self-pay | Admitting: Cardiology

## 2014-08-30 ENCOUNTER — Ambulatory Visit (INDEPENDENT_AMBULATORY_CARE_PROVIDER_SITE_OTHER): Payer: Medicare Other | Admitting: Cardiology

## 2014-08-30 VITALS — BP 168/90 | HR 86 | Ht 72.0 in | Wt 191.0 lb

## 2014-08-30 DIAGNOSIS — E785 Hyperlipidemia, unspecified: Secondary | ICD-10-CM

## 2014-08-30 DIAGNOSIS — I10 Essential (primary) hypertension: Secondary | ICD-10-CM

## 2014-08-30 DIAGNOSIS — L97319 Non-pressure chronic ulcer of right ankle with unspecified severity: Secondary | ICD-10-CM

## 2014-08-30 DIAGNOSIS — Z8719 Personal history of other diseases of the digestive system: Secondary | ICD-10-CM

## 2014-08-30 DIAGNOSIS — I83013 Varicose veins of right lower extremity with ulcer of ankle: Secondary | ICD-10-CM | POA: Diagnosis not present

## 2014-08-30 DIAGNOSIS — I480 Paroxysmal atrial fibrillation: Secondary | ICD-10-CM | POA: Diagnosis not present

## 2014-08-30 MED ORDER — LISINOPRIL 2.5 MG PO TABS: 2.5000 mg | ORAL_TABLET | Freq: Every day | ORAL | Status: DC

## 2014-08-30 NOTE — Progress Notes (Signed)
PCP: Joseph Bolt, MD  Clinic Note: Chief Complaint  Patient presents with  . 6 months    Patient has no complaints.  . Atrial Fibrillation  . Hypertension   HPI: Joseph Landry is a 74 y.o. male with a Cardiovascular Problem List below who presents today for six-month followup.  S/p laser venous ablation Jan 2016.  Wearing compression stockings  Last seen Feb 2016 following Venous Laser Ablation.  His leg ulcer healed up very well after his ablation surgery. Unfortunately is not yet back. Exercise.  Otherwise cardiac standpoint, he is following up for his hypertension and paroxysmal atrial fibrillation treated with metoprolol for rate control and has been on Pradaxa for anticoagulation.  He went out to a trip out to [place] and he does do work in June. He did a lot of walking without too much difficulty.  Interval History: fFom cardiac standpoint Joseph Landry is doing quite well. He is not yet back up walking around much he usually did because of the ulcer --just not back "into the swing of things" -- starting to gain weight & is frustrated by that. He really otherwise denies PND orthopnea normal his lower extremity edema. No chest tightness or pressure with rest or exertion. He has not had any sense of recurring A. Fib. No rapid /irregular heart beats/limits. No syncope/near syncope or TIA/amaurosis fugax. He is now back on Pradaxa with no bleeding issues - melena, hematochezia, hematuria, epistaxis or severe bruising.  Past Medical History  Diagnosis Date  . PAF (paroxysmal atrial fibrillation)     Rate controlled with beta blocker; rhythm control with flecainide; panic regulation with Pradaxa  . Dyslipidemia      on  simvastatin   . Essential hypertension     Labile; often elevated in clinic visits, but never at home  . Wears glasses   . Wears partial dentures   . Arthritis   . Wears hearing aid     both ears  . Varicose veins     s/p R GSFA Endovenous Ablation  . Ulcer  of ankle     right ankle; s/p I&D    Prior Cardiac Evaluation and Past Surgical History: Past Surgical History  Procedure Laterality Date  . Transthoracic echocardiogram  August 2012    Normal LV size and function, normal LA size, EF> 55%.  . Nm myoview ltd - persantine  August 2012    No infarct or ischemia.  . Event monitor  08/22/2011-09/20/2011    shows a fib- startedon FLECAINDE  . Nasal sinus surgery    . Colonoscopy    . I&d extremity Right 12/31/2013    Procedure: IRRIGATION AND DEBRIDEMENT RIGHT ANKLE WOUND;  Surgeon: Joseph Kos, DO;  Location: [facility];  Service: Plastics;  Laterality: Right;  . Application of a-cell of extremity Right 12/31/2013    Procedure: PLACEMENT OF ACELL RIGHT MEDIAL ANKLE;  Surgeon: Joseph Kos, DO;  Location: [facility];  Service: Plastics;  Laterality: Right;  . Endovenous ablation saphenous vein w/ laser Right 01-14-2014    EVLA right greater saphenous vein by Joseph Jews MD   Current Outpatient Prescriptions on File Prior to Visit  Medication Sig Dispense Refill  . dabigatran (PRADAXA) 150 MG CAPS capsule Take 150 mg by mouth 2 (two) times daily.    . flecainide (TAMBOCOR) 100 MG tablet TAKE 1/2 TABLET BY MOUTH EVERY 12 HOURS 30 tablet 6  . folic acid (FOLVITE) 1 MG tablet Take 400 mg by  mouth daily.     . metoprolol succinate (TOPROL-XL) 100 MG 24 hr tablet Take 1 tablet by mouth daily.  0  . Omega-3 Fatty Acids (FISH OIL) 1000 MG CAPS Take by mouth.    . simvastatin (ZOCOR) 40 MG tablet Take 40 mg by mouth every evening.    . triamterene-hydrochlorothiazide (DYAZIDE) 37.5-25 MG per capsule Take 1 capsule by mouth daily.    . vitamin A 8000 UNIT capsule Take 8,000 Units by mouth daily.    . vitamin C (ASCORBIC ACID) 500 MG tablet Take 500 mg by mouth daily.    . vitamin E (VITAMIN E) 400 UNIT capsule Take 400 Units by mouth daily.     No current facility-administered medications on file prior to visit.    Allergies  Allergen Reactions  . Multaq [Dronedarone] Nausea Only    Significant bradycardia   Social History  Substance Use Topics  . Smoking status: Never Smoker   . Smokeless tobacco: Never Used  . Alcohol Use: 0.0 oz/week    0 Standard drinks or equivalent per week     Comment: Only on occasion   Family History  Problem Relation Age of Onset  . Stroke Mother 94  . Heart disease Mother     ROS: A comprehensive Review of Systems was performed Review of Systems  Constitutional: Negative for weight loss and malaise/fatigue.       Wgt gain due to decreased activity -- still has yet to get back into his exercise routine.   HENT: Negative for nosebleeds.   Eyes: Negative for blurred vision.  Respiratory: Negative for cough and shortness of breath.   Cardiovascular: Positive for leg swelling (stable).       Otherwise negative per HPI  Gastrointestinal: Negative for blood in stool and melena.  Genitourinary: Negative for hematuria.  Musculoskeletal: Negative for myalgias.       Still has aluminum aching in his legs but not nearly to the point he had before.  Neurological: Negative.  Negative for dizziness.  Endo/Heme/Allergies: Bruises/bleeds easily.  Psychiatric/Behavioral: Negative.   All other systems reviewed and are negative.    Wt Readings from Last 3 Encounters:  08/30/14 191 lb (86.637 kg)  03/01/14 189 lb 9.6 oz (86.002 kg)  01/26/14 187 lb 9.6 oz (85.095 kg)   PHYSICAL EXAM BP 168/90 mmHg  Pulse 86  Ht 6' (1.829 m)  Wt 191 lb (86.637 kg)  BMI 25.90 kg/m2 General appearance: alert, cooperative, appears stated age, no distress; well-nourished and well-groomed HEENT: [place]/AT, EOMI, MMM, anicteric sclera Neck: no adenopathy, no carotid bruit and no JVD Lungs: clear to auscultation bilaterally, normal percussion bilaterally and non-labored Heart: regular rate and rhythm, S1, S2 normal, no murmur, click, rub or gallop; nondisplaced PMI Abdomen: soft, non-tender;  bowel sounds normal; no masses,  no organomegaly; Extremities: extremities normal, umatic, no cyanosis, trace edema  - R ankle ulcer with dressing in place - C/D/I, no sign of erythema & mildly tender Pulses: 2+ and symmetric; Neurologic: Mental status: Alert, oriented, thought content appropriate; grossly normal   Adult ECG Report  Rate: 86;  Rhythm: normal sinus rhythm and First degree AV block; otherwise normal EKG; stable EKG   Recent Labs April 2015: No recent labs.  No results found for: CHOL, HDL, LDLCALC, LDLDIRECT, TRIG, CHOLHDL Lab Results  Component Value Date   HGB 13.0 12/31/2013    ASSESSMENT / PLAN: Problem List Items Addressed This Visit    Dyslipidemia (Chronic)    Remains on  statin. Labs monitored by PCP. Unfortunately not available.      Relevant Medications   lisinopril (PRINIVIL,ZESTRIL) 2.5 MG tablet   Other Relevant Orders   EKG 12-Lead (Completed)   Essential hypertension - Primary (Chronic)    His blood pressures look too high today, he tells me at home and has also been a little bit. Plan: Add lisinopril 2.5 mg daily. This has some atrial fibrillation that as well. He is on max dose of metoprolol and Dyazide.      Relevant Medications   lisinopril (PRINIVIL,ZESTRIL) 2.5 MG tablet   Other Relevant Orders   EKG 12-Lead (Completed)   H/O: GI bleed    He is on Pradaxa. Would recommend checking hemoglobin with followup routine labs.      PAF (paroxysmal atrial fibrillation) (Chronic)    He remains in sinus rhythm. Rate control with beta blocker, rhythm control with flecainide. Anticoagulation with Pradaxa -- no bleeding issues.      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 I do recommend continued compression stockings and monitoring for further  wounds. Hopefully can get back into exercising now that he is able to walk again.      Relevant Medications   lisinopril (PRINIVIL,ZESTRIL) 2.5 MG tablet       Orders Placed This Encounter  Procedures  . EKG 12-Lead   Meds ordered this encounter  Medications  . lisinopril (PRINIVIL,ZESTRIL) 2.5 MG tablet    Sig: Take 1 tablet (2.5 mg total) by mouth daily.    Dispense:  30 tablet    Refill:  11    Patient Instructions  START LISINOPRIL 2.5 MG ONCE A DAILY- FOR BLOOD PRESSURE.  IF BECOME DIZZY MAY HOLD MEDICATION- LISINOPRIL  NO OTHER CHANGES WITH MEDICATIONS  Your physician wants you to follow-up in [place].    Majorie Santee W. Ellyn Hack, M.D., M.S. Interventional Cardiologist [facility]

## 2014-08-30 NOTE — Patient Instructions (Signed)
START LISINOPRIL 2.5 MG ONCE A DAILY- FOR BLOOD PRESSURE.  IF BECOME DIZZY MAY HOLD MEDICATION- LISINOPRIL  NO OTHER CHANGES WITH MEDICATIONS  Your physician wants you to follow-up in [place]. You will receive a reminder letter in the mail two months in advance. If you don't receive a letter, please call our office to schedule the follow-up appointment.

## 2014-09-01 ENCOUNTER — Encounter: Payer: Self-pay | Admitting: Cardiology

## 2014-09-01 NOTE — Assessment & Plan Note (Signed)
He remains in sinus rhythm. Rate control with beta blocker, rhythm control with flecainide. Anticoagulation with Pradaxa -- no bleeding issues.

## 2014-09-01 NOTE — Assessment & Plan Note (Signed)
His blood pressures look too high today, he tells me at home and has also been a little bit. Plan: Add lisinopril 2.5 mg daily. This has some atrial fibrillation that as well. He is on max dose of metoprolol and Dyazide.

## 2014-09-01 NOTE — Assessment & Plan Note (Signed)
Remains on statin. Labs monitored by PCP. Unfortunately not available.

## 2014-09-01 NOTE — Assessment & Plan Note (Signed)
The well-healed status post endovenous ablation. I do recommend continued compression stockings and monitoring for further wounds. Hopefully can get back into exercising now that he is able to walk again.

## 2014-09-01 NOTE — Assessment & Plan Note (Signed)
He is on Pradaxa. Would recommend checking hemoglobin with followup routine labs.

## 2014-09-08 ENCOUNTER — Telehealth: Payer: Self-pay | Admitting: Cardiology

## 2014-09-08 NOTE — Telephone Encounter (Signed)
Patient has only taken lisinopril 2.5mg  one time. His BP is running low her patient.   131/71 115/67 125/65 128/66 119/67 HR 53-59  He wants to only take lisinopril when his BP is high - he mentioned 140/90?   Informed him I will send the message to Dr. Ellyn Hack and he will be contacted with advice as necessary

## 2014-09-08 NOTE — Telephone Encounter (Signed)
Please call,concerning his new medicine,Lisinopril.

## 2014-09-08 NOTE — Telephone Encounter (Signed)
BPs in 120s are good.   If he is feeling symptomatic with standing low dose ACE-I - OK to hold & use PRN standing BP > 140/90 -- but if the BP gets & stays that high - would use ACE-I daily.  Leonie Man, MD

## 2014-09-08 NOTE — Telephone Encounter (Signed)
Communicated MD advice to patient. He voiced understanding.

## 2014-11-17 ENCOUNTER — Telehealth: Payer: Self-pay | Admitting: Cardiology

## 2014-11-17 NOTE — Telephone Encounter (Signed)
Can do 1 to 1 exchange for Eliquis 5 mg. Would wait 1 day off of Pradaxa (i.e. 2 doses).  Leonie Man, MD

## 2014-11-17 NOTE — Telephone Encounter (Signed)
Relayed instructions to patient, he voiced understanding. Offered to send Rx to pharmacy, pt declined for now - will have Dr. Wilson Singer write around turn of calendar year.

## 2014-11-17 NOTE — Telephone Encounter (Signed)
Pradaxa price increase on pt formulary next year, up 40-50%.  Dr. Wilson Singer informed him either Eliquis or Xarelto should be suitable. Pt's drug coverage will provide for either.  Pt states Dr. Wilson Singer can write prescription but wanted to get feedback from Dr. Ellyn Hack before switching medication.

## 2014-11-17 NOTE — Telephone Encounter (Signed)
Please call,Pradaxa is going up and will be expensive.He wants to know if he can use Eliquis or Xarelto in place of it?

## 2014-12-20 ENCOUNTER — Other Ambulatory Visit: Payer: Self-pay | Admitting: Cardiology

## 2014-12-20 NOTE — Telephone Encounter (Signed)
°*  STAT* If patient is at the pharmacy, call can be transferred to refill team.   1. Which medications need to be refilled? (please list name of each medication and dose if known) Eliquis-new prescription  2. Which pharmacy/location (including street and city if local pharmacy) is medication to be sent to?[facility]-[phone]  3. Do they need a 30 day or 90 day supply? 180 and refills

## 2014-12-21 MED ORDER — APIXABAN 5 MG PO TABS: 5.0000 mg | ORAL_TABLET | Freq: Two times a day (BID) | ORAL | Status: DC

## 2014-12-21 NOTE — Telephone Encounter (Signed)
Rx was send into pt pharmacy

## 2014-12-24 ENCOUNTER — Telehealth: Payer: Self-pay

## 2014-12-24 NOTE — Telephone Encounter (Signed)
Prior auth for Eliquis 5 mg sent to [facility]. 

## 2014-12-28 ENCOUNTER — Telehealth: Payer: Self-pay

## 2014-12-28 NOTE — Telephone Encounter (Signed)
Eliquis approved through 12/24/2015. PA # [id number].

## 2014-12-31 ENCOUNTER — Telehealth: Payer: Self-pay

## 2014-12-31 NOTE — Telephone Encounter (Signed)
Eliquis approved

## 2015-02-06 ENCOUNTER — Other Ambulatory Visit: Payer: Self-pay | Admitting: Cardiology

## 2015-02-07 NOTE — Telephone Encounter (Signed)
Rx request sent to pharmacy.  

## 2015-02-08 ENCOUNTER — Encounter: Payer: Self-pay | Admitting: Cardiology

## 2015-02-08 ENCOUNTER — Ambulatory Visit (INDEPENDENT_AMBULATORY_CARE_PROVIDER_SITE_OTHER): Payer: Medicare Other | Admitting: Cardiology

## 2015-02-08 VITALS — BP 170/80 | HR 52 | Ht 72.0 in | Wt 194.2 lb

## 2015-02-08 DIAGNOSIS — E785 Hyperlipidemia, unspecified: Secondary | ICD-10-CM | POA: Diagnosis not present

## 2015-02-08 DIAGNOSIS — Z8719 Personal history of other diseases of the digestive system: Secondary | ICD-10-CM

## 2015-02-08 DIAGNOSIS — I10 Essential (primary) hypertension: Secondary | ICD-10-CM | POA: Diagnosis not present

## 2015-02-08 DIAGNOSIS — I83013 Varicose veins of right lower extremity with ulcer of ankle: Secondary | ICD-10-CM | POA: Diagnosis not present

## 2015-02-08 DIAGNOSIS — I480 Paroxysmal atrial fibrillation: Secondary | ICD-10-CM

## 2015-02-08 DIAGNOSIS — L97319 Non-pressure chronic ulcer of right ankle with unspecified severity: Secondary | ICD-10-CM

## 2015-02-08 NOTE — Progress Notes (Signed)
PCP: Dwan Bolt, MD  Clinic Note: Chief Complaint  Patient presents with  . Follow-up    6 month//  . Atrial Fibrillation   HPI: EINAR KRIEG is a 75 y.o. male with a Cardiovascular Problem List below who presents today for six-month followup.  S/p laser venous ablation Jan 2016.  Wearing compression stockings  Feb 2016 following Venous Laser Ablation (after foot Sgx).Marland Kitchen  His leg ulcer healed up very well after his ablation surgery. Unfortunately is not yet back. Exercise.  Last seen in August 2016.    Otherwise cardiac standpoint, he is following up for his hypertension and paroxysmal atrial fibrillation treated with metoprolol for rate control and has been on Pradaxa for anticoagulation. --converted to Eliquis in December due to $$ issues.  Tolerating better than Pradaxa (smaller pill).  No traveling over the winter - was caring for his wife who had rotator cuff surgery. Back to walking on TM & riding stationary bike @ [employer] ~5 d / week.    Interval History: Lj presents today doing quite well overall from a cardiac standpoint. He really is not at all sensitive to his A. Fib. 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 He notes that it Pradaxa is hard for him to take it makes a little nauseated is as big. Other than that really no complaints. He works at [employer] doing the treadmill and bicycle at least 5 days a week. At the [facility] blood pressure usually runs in the 120/60-130/60 range. He is finally starting to get back into his exercise following his foot surgeries. As about a month ago when he really started doing things.  He really otherwise has no significant cardiovascular symptoms. No resting or exertional chest tightness/pressure or dyspnea. He denies PND orthopnea normal his lower extremity edema.  He has not had any sense of recurring A. Fib. No rapid /irregular heart  beats/limits. No syncope/near syncope or TIA/amaurosis fugax. He is now back on Eliquis (converted from Pradaxa) with no bleeding issues - melena, hematochezia, hematuria, epistaxis or severe bruising.  Past Medical History  Diagnosis Date  . PAF (paroxysmal atrial fibrillation) (HCC)     Rate controlled with beta blocker; rhythm control with flecainide; anticoagulation with Eliquis  . Dyslipidemia      on  simvastatin   . Essential hypertension     Labile; often elevated in clinic visits, but never at home  . Wears glasses   . Wears partial dentures   . Arthritis   . Wears hearing aid     both ears  . Varicose veins     s/p R GSFA Endovenous Ablation  . Ulcer of ankle (HCC)     right ankle; s/p I&D    Prior Cardiac Evaluation and Past Surgical History: Past Surgical History  Procedure Laterality Date  . Transthoracic echocardiogram  August 2012    Normal LV size and function, normal LA size, EF> 55%.  . Nm myoview ltd - persantine  August 2012    No infarct or ischemia.  . Event monitor  08/22/2011-09/20/2011    shows a fib- startedon FLECAINDE  . Nasal sinus surgery    . Colonoscopy    . I&d extremity Right 12/31/2013    Procedure: IRRIGATION AND DEBRIDEMENT RIGHT ANKLE WOUND;  Surgeon: Theodoro Kos, DO;  Location: [place];  Service: Plastics;  Laterality: Right;  . Application  of a-cell of extremity Right 12/31/2013    Procedure: PLACEMENT OF ACELL RIGHT MEDIAL ANKLE;  Surgeon: Theodoro Kos, DO;  Location: [place];  Service: Plastics;  Laterality: Right;  . Endovenous ablation saphenous vein w/ laser Right 01-14-2014    EVLA right greater saphenous vein by Curt Jews MD   Current Outpatient Prescriptions on File Prior to Visit  Medication Sig Dispense Refill  . apixaban (ELIQUIS) 5 MG TABS tablet Take 1 tablet (5 mg total) by mouth 2 (two) times daily. 180 tablet 1  . flecainide (TAMBOCOR) 100 MG tablet TAKE 1/2 TABLET BY MOUTH EVERY  12 HOURS 30 tablet 1  . folic acid (FOLVITE) 1 MG tablet Take 400 mg by mouth daily.     Marland Kitchen lisinopril (PRINIVIL,ZESTRIL) 2.5 MG tablet Take 1 tablet (2.5 mg total) by mouth daily. 30 tablet 11  . metoprolol succinate (TOPROL-XL) 100 MG 24 hr tablet Take 1 tablet by mouth daily.  0  . Omega-3 Fatty Acids (FISH OIL) 1000 MG CAPS Take by mouth.    . simvastatin (ZOCOR) 40 MG tablet Take 40 mg by mouth every evening.    . triamterene-hydrochlorothiazide (DYAZIDE) 37.5-25 MG per capsule Take 1 capsule by mouth daily.    . vitamin A 8000 UNIT capsule Take 8,000 Units by mouth daily.    . vitamin C (ASCORBIC ACID) 500 MG tablet Take 500 mg by mouth daily.    . vitamin E (VITAMIN E) 400 UNIT capsule Take 400 Units by mouth daily.     No current facility-administered medications on file prior to visit.   Allergies  Allergen Reactions  . Multaq [Dronedarone] Nausea Only    Significant bradycardia   Social History  Substance Use Topics  . Smoking status: Never Smoker   . Smokeless tobacco: Never Used  . Alcohol Use: 0.0 oz/week    0 Standard drinks or equivalent per week     Comment: Only on occasion   Family History  Problem Relation Age of Onset  . Stroke Mother 22  . Heart disease Mother     ROS: A comprehensive Review of Systems was performed Review of Systems  Constitutional: Negative for weight loss and malaise/fatigue.       Wgt gain due to decreased activity -- still has yet to get back into his exercise routine.   HENT: Negative for nosebleeds.   Eyes: Negative for blurred vision.  Respiratory: Negative for cough and shortness of breath.   Cardiovascular: Positive for leg swelling (stable).       Otherwise negative per HPI  Gastrointestinal: Negative for blood in stool and melena.  Genitourinary: Negative for hematuria.  Musculoskeletal: Negative for myalgias.       Still has a mild amount of aching in his legs but not nearly to the point he had before.  Neurological:  Negative.  Negative for dizziness.  Endo/Heme/Allergies: Bruises/bleeds easily.  Psychiatric/Behavioral: Negative.   All other systems reviewed and are negative.    Wt Readings from Last 3 Encounters:  02/08/15 194 lb 3.2 oz (88.089 kg)  08/30/14 191 lb (86.637 kg)  03/01/14 189 lb 9.6 oz (86.002 kg)    PHYSICAL EXAM BP 170/80 mmHg  Pulse 52  Ht 6' (1.829 m)  Wt 194 lb 3.2 oz (88.089 kg)  BMI 26.33 kg/m2 General appearance: alert, cooperative, appears stated age, no distress; well-nourished and well-groomed HEENT: [place]/AT, EOMI, MMM, anicteric sclera Neck: no adenopathy, no carotid bruit and no JVD Lungs: clear to auscultation bilaterally,  normal percussion bilaterally and non-labored Heart: regular rate and rhythm, S1, S2 normal, no murmur, click, rub or gallop; nondisplaced PMI Abdomen: soft, non-tender; bowel sounds normal; no masses,  no organomegaly; Extremities: extremities normal, umatic, no cyanosis, trace edema  - R ankle ulcer with dressing in place - C/D/I, no sign of erythema & mildly tender Pulses: 2+ and symmetric; Neurologic: Mental status: Alert, oriented, thought content appropriate; grossly normal   Adult ECG Report  Rate: 52;  Rhythm: sinus bradycardia and First degree AV block; otherwise normal EKG; stable EKG   Recent Labs April 2015: No recent labs.  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)    Finally healing up some. Hopefully this will be the end of the complications. He had endovenous ablation which is helped. Continuing compression stockings once his wound is healed. Now is back exercising, hopefully this will not cause exacerbation.      PAF (paroxysmal atrial fibrillation) (HCC) (Chronic)    Relatively stable. Remains rate controlled  with Toprol, and has rhythm control with flecainide. He is down to 50 mg twice a day. We talked about when necessary dosing where he would take a full 200 mg flecainide for breakthrough along with an additional tablet of metoprolol. Now converted from Pradaxa to [facility]. Tolerating it better actually. No bleeding complications.  This patients CHA2DS2-VASc Score and unadjusted Ischemic Stroke Rate (% per year) is equal to 3.2 % stroke rate/year from a score of 3  Above score calculated as 1 point each if present [CHF, HTN, DM, Vascular=MI/PAD/Aortic Plaque, Age if 65-74, or Male] Above score calculated as 2 points each if present [Age > 75, or Stroke/TIA/TE]        Relevant Orders   EKG 12-Lead (Completed)   H/O: GI bleed    No further bleeding issues. Monitor closely while on ELIQUIS. Hopefully this does remain true for lower risk profile.      Essential hypertension - Primary (Chronic)    Relatively labile. Blood pressure is elevated today. But at home he says usually very good. The [facility] readings are excellent. I'm reluctant to act on occasion here which may be white coat syndrome related. May need to consider increasing lisinopril from 2.5-5. This does have some beneficial effects of A. fib. Also on metoprolol plus Dyazide.      Relevant Orders   EKG 12-Lead (Completed)   Dyslipidemia (Chronic)    With PAD, we would want at least LDL less than 100, closer to 70. He is on simvastatin 40 mg without complications such as myalgias or memory issues. Labs are monitored by PCP but not available.      Relevant Orders   EKG 12-Lead (Completed)       Orders Placed This Encounter  Procedures  . EKG 12-Lead   No orders of the defined types were placed in this encounter.    Patient Instructions  START LISINOPRIL 2.5 MG ONCE A DAILY- FOR BLOOD PRESSURE.  IF BECOME DIZZY MAY HOLD MEDICATION- LISINOPRIL  NO OTHER CHANGES WITH MEDICATIONS  Your physician wants you to follow-up  in [place] Antoninette Lerner.    Ramaj Frangos W. Ellyn Hack, M.D., M.S. Interventional Cardiologist [facility]

## 2015-02-08 NOTE — Patient Instructions (Addendum)
NO CHANGE WITH CURRENT MEDICATIONS   Your physician wants you to follow-up in 6 MONTHS WITH DR HARDING.  You will receive a reminder letter in the mail two months in advance. If you don't receive a letter, please call our office to schedule the follow-up appointment.  If you need a refill on your cardiac medications before your next appointment, please call your pharmacy.   

## 2015-02-10 NOTE — Assessment & Plan Note (Addendum)
Relatively labile. Blood pressure is elevated today. But at home he says usually very good. The [employer] readings are excellent. I'm reluctant to act on occasion here which may be white coat syndrome related. May need to consider increasing lisinopril from 2.5-5. This does have some beneficial effects of A. fib. Also on metoprolol plus Dyazide.

## 2015-02-10 NOTE — Assessment & Plan Note (Signed)
No further bleeding issues. Monitor closely while on ELIQUIS. Hopefully this does remain true for lower risk profile.

## 2015-02-10 NOTE — Assessment & Plan Note (Signed)
Relatively stable. Remains rate controlled with Toprol, and has rhythm control with flecainide. He is down to 50 mg twice a day. We talked about when necessary dosing where he would take a full 200 mg flecainide for breakthrough along with an additional tablet of metoprolol. Now converted from Pradaxa to [facility]. Tolerating it better actually. No bleeding complications.  This patients CHA2DS2-VASc Score and unadjusted Ischemic Stroke Rate (% per year) is equal to 3.2 % stroke rate/year from a score of 3  Above score calculated as 1 point each if present [CHF, HTN, DM, Vascular=MI/PAD/Aortic Plaque, Age if 65-74, or Male] Above score calculated as 2 points each if present [Age > 75, or Stroke/TIA/TE]

## 2015-02-10 NOTE — Assessment & Plan Note (Signed)
With PAD, we would want at least LDL less than 100, closer to 70. He is on simvastatin 40 mg without complications such as myalgias or memory issues. Labs are monitored by PCP but not available.

## 2015-02-10 NOTE — Assessment & Plan Note (Signed)
Finally healing up some. Hopefully this will be the end of the complications. He had endovenous ablation which is helped. Continuing compression stockings once his wound is healed. Now is back exercising, hopefully this will not cause exacerbation.

## 2015-04-03 ENCOUNTER — Other Ambulatory Visit: Payer: Self-pay | Admitting: Cardiology

## 2015-04-04 NOTE — Telephone Encounter (Signed)
Rx(s) sent to pharmacy electronically.  

## 2015-06-21 ENCOUNTER — Other Ambulatory Visit: Payer: Self-pay | Admitting: *Deleted

## 2015-06-21 ENCOUNTER — Other Ambulatory Visit: Payer: Self-pay | Admitting: Cardiology

## 2015-06-21 MED ORDER — APIXABAN 5 MG PO TABS: 5.0000 mg | ORAL_TABLET | Freq: Two times a day (BID) | ORAL | Status: DC

## 2015-09-19 ENCOUNTER — Encounter: Payer: Self-pay | Admitting: Cardiology

## 2015-09-19 ENCOUNTER — Ambulatory Visit (INDEPENDENT_AMBULATORY_CARE_PROVIDER_SITE_OTHER): Payer: Medicare Other | Admitting: Cardiology

## 2015-09-19 VITALS — BP 170/68 | HR 48 | Ht 71.0 in | Wt 186.0 lb

## 2015-09-19 DIAGNOSIS — I10 Essential (primary) hypertension: Secondary | ICD-10-CM

## 2015-09-19 DIAGNOSIS — I480 Paroxysmal atrial fibrillation: Secondary | ICD-10-CM | POA: Diagnosis not present

## 2015-09-19 DIAGNOSIS — E785 Hyperlipidemia, unspecified: Secondary | ICD-10-CM

## 2015-09-19 NOTE — Assessment & Plan Note (Signed)
Overall stable with rate control on beta blocker and rhythm control with flecainide. No sense of any recurrence. No bleeding issues on Eliquis. He likes Eliquis better than Pradaxa because of easy to take pill and less nausea.

## 2015-09-19 NOTE — Assessment & Plan Note (Signed)
He does have some PAD, albeit not significant. Would continue on statin with an LDL goal least less than 100  Lastly monitored by PCP.

## 2015-09-19 NOTE — Assessment & Plan Note (Signed)
He clearly has whitecoat syndrome. He brings in with him a list of his blood pressures at home and they're all in the 121 and 30 mmHg range for systolic pressures. He is on Dyazide and metoprolol succinate. Did not tolerate ACE inhibitor because of dizziness, he stopped it on his own.  Plan: Continue to monitor pressures. He checks them at home his lungs are stable, would not add any new medication.

## 2015-09-19 NOTE — Progress Notes (Signed)
PCP: Dwan Bolt, MD  Clinic Note: Chief Complaint  Patient presents with  . Follow-up    atrial fibrillation   HPI: Joseph Landry is a 75 y.o. male with a Cardiovascular Problem List below who presents today for six-month followup.  S/p laser venous ablation Jan 2016.  Wearing compression stockings  Feb 2016 following Venous Laser Ablation (after foot Sgx).Marland Kitchen  His leg ulcer healed up very well after his ablation surgery. Unfortunately is not yet back. Exercise.  Last seen in February of 2017 -- was doing very well. He was concerned about having skin cancer on his cheek. He was working out at [employer] doing treadmill and bicycle usually 5 days a week. Was noting well controlled blood pressures. Finally excited to get back to his exercise after his foot surgeries.  Otherwise cardiac standpoint, he is following up for his hypertension and paroxysmal atrial fibrillation treated with metoprolol for rate control and has been on Pradaxa for anticoagulation. --converted to Eliquis in December due to $$ issues.  Tolerating better than Pradaxa (smaller pill).  Did not tolerte taking Lisinopril. BP @ home in 120-130/70s mmHg range   No recent hospitalizations or studies. Had trip to [place] this summer.  Interval History: Joseph Landry presents today doing quite well overall from a cardiac standpoint.   Not as often [employer] workout.  Still working everyday cutting hair.   He really is not at all sensitive to his A. Fib, but he denies any recurrent palpitation symptoms. He is essentially asymptomatic without any major complaints. He is not quite as active as he used to be, but does a lot of walking. He also does lots of yard work. He just got back from vacation.   He really otherwise has no significant cardiovascular symptoms. No resting or exertional chest tightness/pressure or dyspnea. He denies PND orthopnea normal his lower extremity edema.  He has not had any sense of recurring A. Fib.  No rapid /irregular heart beats/limits. No syncope/near syncope or TIA/amaurosis fugax. He is on Eliquis (converted from Pradaxa) with no bleeding issues - melena, hematochezia, hematuria, epistaxis or severe bruising.  Past Medical History:  Diagnosis Date  . Arthritis   . Dyslipidemia     on  simvastatin   . Essential hypertension    Labile; often elevated in clinic visits, but never at home  . PAF (paroxysmal atrial fibrillation) (HCC)    Rate controlled with beta blocker; rhythm control with flecainide; anticoagulation with Eliquis  . Ulcer of ankle (HCC)    right ankle; s/p I&D  . Varicose veins    s/p R GSFA Endovenous Ablation  . Wears glasses   . Wears hearing aid    both ears  . Wears partial dentures     Prior Cardiac Evaluation and Past Surgical History: Past Surgical History:  Procedure Laterality Date  . APPLICATION OF A-CELL OF EXTREMITY Right 12/31/2013   Procedure: PLACEMENT OF ACELL RIGHT MEDIAL ANKLE;  Surgeon: Theodoro Kos, DO;  Location: [facility];  Service: Plastics;  Laterality: Right;  . COLONOSCOPY    . ENDOVENOUS ABLATION SAPHENOUS VEIN W/ LASER Right 01-14-2014   EVLA right greater saphenous vein by Joseph Jews MD  . event monitor  08/22/2011-09/20/2011   shows a fib- startedon FLECAINDE  . I&D EXTREMITY Right 12/31/2013   Procedure: IRRIGATION AND DEBRIDEMENT RIGHT ANKLE WOUND;  Surgeon: Theodoro Kos, DO;  Location: [facility];  Service: Plastics;  Laterality: Right;  . NASAL  SINUS SURGERY    . NM MYOVIEW LTD - Persantine  August 2012   No infarct or ischemia.  Domingo Dimes ECHOCARDIOGRAM  August 2012   Normal LV size and function, normal LA size, EF> 55%.    Prior to Admission medications   Medication Sig Start Date End Date Taking? Authorizing Provider  apixaban (ELIQUIS) 5 MG TABS tablet Take 1 tablet (5 mg total) by mouth 2 (two) times daily. 06/21/15  Yes Joseph Man, MD  flecainide (TAMBOCOR) 100 MG  tablet Take 0.5 tablets (50 mg total) by mouth 2 (two) times daily. 04/04/15  Yes Joseph Sine, MD  folic acid (FOLVITE) 1 MG tablet Take 400 mg by mouth daily.    Yes Historical Provider, MD  metoprolol succinate (TOPROL-XL) 100 MG 24 hr tablet Take 1 tablet by mouth daily. 11/02/13  Yes Historical Provider, MD  Omega-3 Fatty Acids (FISH OIL) 1000 MG CAPS Take by mouth.   Yes Historical Provider, MD  simvastatin (ZOCOR) 40 MG tablet Take 40 mg by mouth every evening.   Yes Historical Provider, MD  triamterene-hydrochlorothiazide (DYAZIDE) 37.5-25 MG per capsule Take 1 capsule by mouth daily.   Yes Historical Provider, MD  vitamin A 8000 UNIT capsule Take 8,000 Units by mouth daily.   Yes Historical Provider, MD  vitamin C (ASCORBIC ACID) 500 MG tablet Take 500 mg by mouth daily.   Yes Historical Provider, MD  vitamin E (VITAMIN E) 400 UNIT capsule Take 400 Units by mouth daily.   Yes Historical Provider, MD    Allergies  Allergen Reactions  . Multaq [Dronedarone] Nausea Only    Made pt feel bad and worn out    Social History  Substance Use Topics  . Smoking status: Never Smoker  . Smokeless tobacco: Never Used  . Alcohol use 0.0 oz/week     Comment: Only on occasion   Family History  Problem Relation Age of Onset  . Stroke Mother 22  . Heart disease Mother     ROS: A comprehensive Review of Systems was performed Review of Systems  Constitutional: Positive for weight loss (cut back on eating & increased exercise.). Negative for malaise/fatigue.       Wgt gain due to decreased activity -- still has yet to get back into his exercise routine.   HENT: Negative for nosebleeds.   Eyes: Negative for blurred vision.  Respiratory: Negative for cough and shortness of breath.   Cardiovascular: Positive for leg swelling (stable).       Otherwise negative per HPI  Gastrointestinal: Negative for blood in stool, melena and nausea.  Genitourinary: Negative for hematuria.  Musculoskeletal:  Negative for myalgias.       Still has a mild amount of aching in his legs but not nearly to the point he had before.  Neurological: Negative.  Negative for dizziness.  Endo/Heme/Allergies: Bruises/bleeds easily.  Psychiatric/Behavioral: Negative.  Negative for depression and memory loss.  All other systems reviewed and are negative.    Wt Readings from Last 3 Encounters:  09/19/15 186 lb (84.4 kg)  02/08/15 194 lb 3.2 oz (88.1 kg)  08/30/14 191 lb (86.6 kg)  -- + 10 b wgt loss - intentional   PHYSICAL EXAM BP (!) 170/68 (BP Location: Right Arm, Patient Position: Sitting, Cuff Size: Normal)   Pulse (!) 48   Ht 5\' 11"  (1.803 m)   Wt 186 lb (84.4 kg)   BMI 25.94 kg/m  General appearance: alert, cooperative, appears stated age, no distress;  well-nourished and well-groomed HEENT: [place]/AT, EOMI, MMM, anicteric sclera Neck: no adenopathy, no carotid bruit and no JVD Lungs: clear to auscultation bilaterally, normal percussion bilaterally and non-labored Heart: regular rate and rhythm, S1, S2 normal, no murmur, click, rub or gallop; nondisplaced PMI Abdomen: soft, non-tender; bowel sounds normal; no masses,  no organomegaly; Extremities: extremities normal, umatic, no cyanosis, trace edema  - R ankle ulcer with dressing in place - C/D/I, no sign of erythema & mildly tender Pulses: 2+ and symmetric; Neurologic: Mental status: Alert, oriented, thought content appropriate; grossly normal   Adult ECG Report  Rate: 49;  Rhythm: sinus bradycardia and First degree AV block (PR interval 270). Otherwise Normal axis, intervals and durations.; otherwise normal EKG; stable EKG   Recent Labs April 2015: No recent labs.  No results found for: CHOL, HDL, LDLCALC, LDLDIRECT, TRIG, CHOLHDL Lab Results  Component Value Date   HGB 13.0 12/31/2013    ASSESSMENT / PLAN: Problem List Items Addressed This Visit    PAF (paroxysmal atrial fibrillation) ([place]): CHA2DS2Vasc Score 3, On Eliquis - Primary  (Chronic)    Overall stable with rate control on beta blocker and rhythm control with flecainide. No sense of any recurrence. No bleeding issues on Eliquis. He likes Eliquis better than Pradaxa because of easy to take pill and less nausea.       Relevant Orders   EKG 12-Lead   Essential hypertension (Chronic)    He clearly has whitecoat syndrome. He brings in with him a list of his blood pressures at home and they're all in the 121 and 30 mmHg range for systolic pressures. He is on Dyazide and metoprolol succinate. Did not tolerate ACE inhibitor because of dizziness, he stopped it on his own.  Plan: Continue to monitor pressures. He checks them at home his lungs are stable, would not add any new medication.      Relevant Orders   EKG 12-Lead   Dyslipidemia, goal LDL below 100 (Chronic)    He does have some PAD, albeit not significant. Would continue on statin with an LDL goal least less than 100  Lastly monitored by PCP.       Other Visit Diagnoses   None.     Orders Placed This Encounter  Procedures  . EKG 12-Lead    Order Specific Question:   Where should this test be performed    Answer:   Other   No orders of the defined types were placed in this encounter.   Patient Instructions   Your physician wants you to follow-up in 6 MONTH WITH APP, 1 yr with DR Landrey Mahurin.    Glenetta Hew, M.D., M.S. Interventional Cardiologist   Pager # [phone] Phone # [phone] [address] [place], [place] [address]

## 2015-09-19 NOTE — Patient Instructions (Signed)
NO CHANGES WITH CURRENT MEDICATIONS   Your physician recommends that you schedule a follow-up appointment in: [place] [facility]    Your physician wants you to follow-up in: [place]. You will receive a reminder letter in the mail two months in advance. If you don't receive a letter, please call our office to schedule the follow-up appointment.   If you need a refill on your cardiac medications before your next appointment, please call your pharmacy.

## 2016-02-28 ENCOUNTER — Other Ambulatory Visit: Payer: Self-pay | Admitting: Cardiovascular Disease

## 2016-03-12 ENCOUNTER — Encounter: Payer: Self-pay | Admitting: Physician Assistant

## 2016-03-12 ENCOUNTER — Ambulatory Visit (INDEPENDENT_AMBULATORY_CARE_PROVIDER_SITE_OTHER): Payer: Medicare Other | Admitting: Physician Assistant

## 2016-03-12 VITALS — BP 181/84 | HR 49 | Ht 71.0 in | Wt 195.2 lb

## 2016-03-12 DIAGNOSIS — I480 Paroxysmal atrial fibrillation: Secondary | ICD-10-CM | POA: Diagnosis not present

## 2016-03-12 DIAGNOSIS — E785 Hyperlipidemia, unspecified: Secondary | ICD-10-CM | POA: Diagnosis not present

## 2016-03-12 DIAGNOSIS — I10 Essential (primary) hypertension: Secondary | ICD-10-CM | POA: Diagnosis not present

## 2016-03-12 MED ORDER — FLECAINIDE ACETATE 100 MG PO TABS: ORAL_TABLET | ORAL | 3 refills | Status: DC

## 2016-03-12 MED ORDER — APIXABAN 5 MG PO TABS: 5.0000 mg | ORAL_TABLET | Freq: Two times a day (BID) | ORAL | 3 refills | Status: DC

## 2016-03-12 NOTE — Patient Instructions (Signed)
Medication Instructions:  Your physician recommends that you continue on your current medications as directed. Please refer to the Current Medication list given to you today.   Follow-Up: Your physician wants you to follow-up in: 6 months with Dr. Ellyn Hack. You will receive a reminder letter in the mail two months in advance. If you don't receive a letter, please call our office to schedule the follow-up appointment.    Any Other Special Instructions Will Be Listed Below (If Applicable).     If you need a refill on your cardiac medications before your next appointment, please call your pharmacy.

## 2016-03-12 NOTE — Progress Notes (Signed)
Cardiology Office Note    Date:  03/12/2016   ID:  Joseph Landry, DOB 05-Oct-1940, MRN [id number]  PCP:  Dwan Bolt, MD  Cardiologist:  Dr. Ellyn Hack  Chief Complaint  Patient presents with  . Follow-up    6 months; seen for Dr. Ellyn Hack    History of Present Illness:  Joseph Landry is a 76 y.o. male with PMH of varicose veins s/p endovenous ablation in 01/2014, PAF, hypertension, and hyperlipidemia. His lower extremity edema is controlled with compression stockings. After his venous laser ablation, his leg ulcer healed up very well. He is paroxysmal atrial fibrillation and has been well controlled on metoprolol for rate control. He was previously on Pradaxa for anticoagulation, this was later converted to eliquis in December 2016 due to cost issues. He does not tolerate taking lisinopril due to dizziness.  His last office visit was on 05/19/2015, he has been doing quite well at the time. He went back to [facility] for work, however has not been doing so very regularly. He does not have clear cardiac awareness for atrial fibrillation. Otherwise he denied any palpitation symptoms. His blood pressure was quite elevated, this was attributed to white coat syndrome.  He presents today for follow-up, he continue to exercise at [facility], although he has not been exercising as frequently due to the recent weather. He has not noticed any palpitation. He denies any lower extremity edema either. He has been doing very well and has not experienced any chest discomfort or shortness of breath. He denies any bleeding issue on eliquis. Apparently his LDL was borderline high recently, his primary care physician has changed him to Crestor 20 mg. I will defer to his primary care physician to follow-up on the labs.   Past Medical History:  Diagnosis Date  . Arthritis   . Dyslipidemia     on  simvastatin   . Essential hypertension    Labile; often elevated in clinic visits, but never at home  . PAF (paroxysmal  atrial fibrillation) (HCC)    Rate controlled with beta blocker; rhythm control with flecainide; anticoagulation with Eliquis  . Ulcer of ankle (HCC)    right ankle; s/p I&D  . Varicose veins    s/p R GSFA Endovenous Ablation  . Wears glasses   . Wears hearing aid    both ears  . Wears partial dentures     Past Surgical History:  Procedure Laterality Date  . APPLICATION OF A-CELL OF EXTREMITY Right 12/31/2013   Procedure: PLACEMENT OF ACELL RIGHT MEDIAL ANKLE;  Surgeon: Theodoro Kos, DO;  Location: [facility];  Service: Plastics;  Laterality: Right;  . COLONOSCOPY    . ENDOVENOUS ABLATION SAPHENOUS VEIN W/ LASER Right 01-14-2014   EVLA right greater saphenous vein by Curt Jews MD  . event monitor  08/22/2011-09/20/2011   shows a fib- startedon FLECAINDE  . I&D EXTREMITY Right 12/31/2013   Procedure: IRRIGATION AND DEBRIDEMENT RIGHT ANKLE WOUND;  Surgeon: Theodoro Kos, DO;  Location: [facility];  Service: Plastics;  Laterality: Right;  . NASAL SINUS SURGERY    . NM MYOVIEW LTD - Persantine  August 2012   No infarct or ischemia.  Domingo Dimes ECHOCARDIOGRAM  August 2012   Normal LV size and function, normal LA size, EF> 55%.    Current Medications: Outpatient Medications Prior to Visit  Medication Sig Dispense Refill  . folic acid (FOLVITE) 1 MG tablet Take 400 mg by mouth daily.     Marland Kitchen  metoprolol succinate (TOPROL-XL) 100 MG 24 hr tablet Take 1 tablet by mouth daily.  0  . Omega-3 Fatty Acids (FISH OIL) 1000 MG CAPS Take by mouth.    . triamterene-hydrochlorothiazide (DYAZIDE) 37.5-25 MG per capsule Take 1 capsule by mouth daily.    . vitamin A 8000 UNIT capsule Take 8,000 Units by mouth daily.    . vitamin C (ASCORBIC ACID) 500 MG tablet Take 500 mg by mouth daily.    . vitamin E (VITAMIN E) 400 UNIT capsule Take 400 Units by mouth daily.    Marland Kitchen apixaban (ELIQUIS) 5 MG TABS tablet Take 1 tablet (5 mg total) by mouth 2 (two) times daily. 180  tablet 3  . flecainide (TAMBOCOR) 100 MG tablet TAKE 1/2 TABLET(50 MG) BY MOUTH TWICE DAILY 30 tablet 0  . simvastatin (ZOCOR) 40 MG tablet Take 40 mg by mouth every evening.     No facility-administered medications prior to visit.      Allergies:   Multaq [dronedarone]   Social History   Social History  . Marital status: Married    Spouse name: N/A  . Number of children: N/A  . Years of education: N/A   Social History Main Topics  . Smoking status: Never Smoker  . Smokeless tobacco: Never Used  . Alcohol use 0.0 oz/week     Comment: Only on occasion  . Drug use: No  . Sexual activity: Not Asked   Other Topics Concern  . None   Social History Narrative   He is a married father of 2, grandfather of 1.  He is very active but not with routine exercise. He previously was walking 2to 3 miles a day and he is trying to get back to that. An occasional social alcoholic drink but does not smoke.       He recently went on a trip with his daughter and grandson to [employer] [place] and was able to walk all throughout [employer] without any problems.     Family History:  The patient's family history includes Heart disease in his mother; Stroke (age of onset: 4) in his mother.   ROS:   Please see the history of present illness.    ROS All other systems reviewed and are negative.   PHYSICAL EXAM:   VS:  BP (!) 181/84   Pulse (!) 49   Ht 5\' 11"  (1.803 m)   Wt 195 lb 3.2 oz (88.5 kg)   BMI 27.22 kg/m    GEN: Well nourished, well developed, in no acute distress  HEENT: normal  Neck: no JVD, carotid bruits, or masses Cardiac: RRR; no murmurs, rubs, or gallops,no edema  Respiratory:  clear to auscultation bilaterally, normal work of breathing GI: soft, nontender, nondistended, + BS MS: no deformity or atrophy  Skin: warm and dry, no rash Neuro:  Alert and Oriented x 3, Strength and sensation are intact Psych: euthymic mood, full affect  Wt Readings from Last 3 Encounters:    03/12/16 195 lb 3.2 oz (88.5 kg)  09/19/15 186 lb (84.4 kg)  02/08/15 194 lb 3.2 oz (88.1 kg)      Studies/Labs Reviewed:   EKG:  EKG is ordered today.  The ekg ordered today demonstrates Normal sinus rhythm without significant ST-T wave changes.  Recent Labs: No results found for requested labs within last 8760 hours.   Lipid Panel No results found for: CHOL, TRIG, HDL, CHOLHDL, VLDL, LDLCALC, LDLDIRECT  Additional studies/ records that were reviewed today include:  Myoview 08/04/2010    ASSESSMENT:    1. PAF (paroxysmal atrial fibrillation) ([place]): CHA2DS2Vasc Score 3, On Eliquis   2. Essential hypertension   3. Hyperlipidemia, unspecified hyperlipidemia type      PLAN:  In order of problems listed above:  1. PAF on eliquis: He is doing very well on flecainide 50 mg twice a day and Toprol-XL 100 mg daily. He has not had any recurrence of atrial fibrillation. He denies any bleeding issues on eliquis. We'll continue on current medication.  - CHA2DS2-Vasc score 3 (age, HTN)  2. Hypertension: His blood pressure today is 181/84, he brought his own blood pressure diary. He has a history of whitecoat syndrome. He is blood pressure is mainly in the 130s at home, very rarely go up to 140s. I will continue him on his current medication. He is on Toprol-XL and Dyazide.  3. Hyperlipidemia: On Crestor 20 mg daily. Managed by PCP    Medication Adjustments/Labs and Tests Ordered: Current medicines are reviewed at length with the patient today.  Concerns regarding medicines are outlined above.  Medication changes, Labs and Tests ordered today are listed in the Patient Instructions below. Patient Instructions  Medication Instructions:  Your physician recommends that you continue on your current medications as directed. Please refer to the Current Medication list given to you today.   Follow-Up: Your physician wants you to follow-up in: 6 months with Dr. Ellyn Hack. You will receive a  reminder letter in the mail two months in advance. If you don't receive a letter, please call our office to schedule the follow-up appointment.    Any Other Special Instructions Will Be Listed Below (If Applicable).     If you need a refill on your cardiac medications before your next appointment, please call your pharmacy.      Hilbert Corrigan, [place]  03/12/2016 1:21 PM    [facility] [address], [place], [place]  [address] Phone: [phone]; Fax: [phone]

## 2016-03-29 ENCOUNTER — Other Ambulatory Visit: Payer: Self-pay | Admitting: Cardiovascular Disease

## 2016-07-11 ENCOUNTER — Other Ambulatory Visit: Payer: Self-pay | Admitting: *Deleted

## 2016-07-11 NOTE — Telephone Encounter (Signed)
Needs recent BMET & CBC

## 2016-07-12 MED ORDER — APIXABAN 5 MG PO TABS: 5.0000 mg | ORAL_TABLET | Freq: Two times a day (BID) | ORAL | 0 refills | Status: DC

## 2016-07-12 NOTE — Telephone Encounter (Signed)
Recent blood labs requested at [facility]

## 2016-09-20 ENCOUNTER — Telehealth: Payer: Self-pay | Admitting: Cardiology

## 2016-09-20 NOTE — Telephone Encounter (Signed)
New Message ° ° pt verbalized that she is returning call for rn °

## 2016-09-20 NOTE — Telephone Encounter (Signed)
Spoke w wife of pt (OK per DPR), who states they had a missed call from our office. I reviewed chart, no recent labwork/tests/etc to call back. Pt on schedule for appt w Dr. Ellyn Hack tomorrow. Spoke w Ivin Booty, she states she did not call pt. Wife aware call likely was an appt reminder from schedulers. They are aware of appt time and will come to [facility] tomorrow.

## 2016-09-21 ENCOUNTER — Ambulatory Visit (INDEPENDENT_AMBULATORY_CARE_PROVIDER_SITE_OTHER): Payer: Medicare Other | Admitting: Cardiology

## 2016-09-21 ENCOUNTER — Encounter: Payer: Self-pay | Admitting: Cardiology

## 2016-09-21 VITALS — BP 192/80 | HR 51 | Ht 71.0 in | Wt 193.0 lb

## 2016-09-21 DIAGNOSIS — I10 Essential (primary) hypertension: Secondary | ICD-10-CM | POA: Diagnosis not present

## 2016-09-21 DIAGNOSIS — I480 Paroxysmal atrial fibrillation: Secondary | ICD-10-CM

## 2016-09-21 DIAGNOSIS — E785 Hyperlipidemia, unspecified: Secondary | ICD-10-CM | POA: Diagnosis not present

## 2016-09-21 DIAGNOSIS — Z87448 Personal history of other diseases of urinary system: Secondary | ICD-10-CM

## 2016-09-21 NOTE — Patient Instructions (Addendum)
No change with current medication and treatment.   Your physician recommends that you schedule a follow-up appointment in 6 months with Mr. Eulas Post.    Your physician wants you to follow-up in 12 months with Dr Ellyn Hack. You will receive a reminder letter in the mail two months in advance. If you don't receive a letter, please call our office to schedule the follow-up appointment.  If you need a refill on your cardiac medications before your next appointment, please call your pharmacy.

## 2016-09-21 NOTE — Progress Notes (Signed)
PCP: Deland Pretty, MD  Clinic Note: Chief Complaint  Patient presents with  . Follow-up  . Atrial Fibrillation  . Hypertension    HPI: Joseph Landry is a 76 y.o. male with a PMH below who presents today for 6 month f/u for PAF, hypertension and hyperlipidemia as well as history of lower extremity edema with endovenous ablation in January 2016.  Joseph Landry was last seen on March 12 by Mr. Eulas Post, [place] - was doing well. BP as usual seemed high in the clinic, but his home readings show more normal numbers. - NO change.  No recurrence of Afib.  Recent Hospitalizations: None  Studies Personally Reviewed - (if available, images/films reviewed: From Epic Chart or Care Everywhere)  None  Interval History: Joseph Landry returns today indicating that he felt like his heart rate went irregular, but not fast --> This was several months ago, He took his two doses of Flecainide & the irregular rhythm broke.  Since that time, he has not had any further episodes of irregular heartbeat.  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  Despite this, he continued to be very active doing yard work and other activities with no issues or cardiac standpoint.  No chest pain or shortness of breath with rest or exertion. No PND, orthopnea or edema. No lightheadedness, dizziness, weakness or syncope/near syncope. No TIA/amaurosis fugax symptoms. No melena, hematochezia, hematuria, or epstaxis. No claudication.  ROS: A comprehensive was performed. Review of Systems  Constitutional: Negative for malaise/fatigue.  HENT: Negative for congestion and nosebleeds.   Eyes: Negative for blurred vision.  Respiratory: Negative for cough, shortness of breath and wheezing.   Cardiovascular: Negative for leg swelling.  Musculoskeletal: Negative for myalgias.  Neurological: Negative for dizziness and headaches.  Psychiatric/Behavioral:  Negative.   All other systems reviewed and are negative.  I have reviewed and (if needed) personally updated the patient's problem list, medications, allergies, past medical and surgical history, social and family history.   Past Medical History:  Diagnosis Date  . Arthritis   . Dyslipidemia     on  simvastatin   . Essential hypertension    Labile; often elevated in clinic visits, but never at home  . PAF (paroxysmal atrial fibrillation) (HCC)    Rate controlled with beta blocker; rhythm control with flecainide; anticoagulation with Eliquis  . Ulcer of ankle (HCC)    right ankle; s/p I&D  . Varicose veins    s/p R GSFA Endovenous Ablation  . Wears glasses   . Wears hearing aid    both ears  . Wears partial dentures     Past Surgical History:  Procedure Laterality Date  . APPLICATION OF A-CELL OF EXTREMITY Right 12/31/2013   Procedure: PLACEMENT OF ACELL RIGHT MEDIAL ANKLE;  Surgeon: Theodoro Kos, DO;  Location: [facility];  Service: Plastics;  Laterality: Right;  . COLONOSCOPY    . ENDOVENOUS ABLATION SAPHENOUS VEIN W/ LASER Right 01-14-2014   EVLA right greater saphenous vein by Curt Jews MD  . event monitor  08/22/2011-09/20/2011   shows a fib- startedon FLECAINDE  . I&D EXTREMITY Right 12/31/2013   Procedure: IRRIGATION AND DEBRIDEMENT RIGHT ANKLE WOUND;  Surgeon: Theodoro Kos, DO;  Location: [facility];  Service: Plastics;  Laterality: Right;  . NASAL SINUS SURGERY    . NM MYOVIEW LTD - Persantine  August 2012  No infarct or ischemia.  Domingo Dimes ECHOCARDIOGRAM  August 2012   Normal LV size and function, normal LA size, EF> 55%.    Current Meds  Medication Sig  . apixaban (ELIQUIS) 5 MG TABS tablet Take 1 tablet (5 mg total) by mouth 2 (two) times daily.  . flecainide (TAMBOCOR) 100 MG tablet TAKE 1/2 TABLET(50 MG) BY MOUTH TWICE DAILY  . folic acid (FOLVITE) 564 MCG tablet Take 400 mcg by mouth daily.   . metoprolol succinate  (TOPROL-XL) 100 MG 24 hr tablet Take 1 tablet by mouth daily.  . Omega-3 Fatty Acids (FISH OIL) 1000 MG CAPS Take by mouth.  . rosuvastatin (CRESTOR) 20 MG tablet Take 20 mg by mouth daily.  Marland Kitchen triamterene-hydrochlorothiazide (DYAZIDE) 37.5-25 MG per capsule Take 1 capsule by mouth daily.  . vitamin A 8000 UNIT capsule Take 8,000 Units by mouth daily.  . vitamin C (ASCORBIC ACID) 500 MG tablet Take 500 mg by mouth daily.  . vitamin E (VITAMIN E) 400 UNIT capsule Take 400 Units by mouth daily.    Allergies  Allergen Reactions  . Multaq [Dronedarone] Nausea Only    Made pt feel bad and worn out     Social History   Social History  . Marital status: Married    Spouse name: N/A  . Number of children: N/A  . Years of education: N/A   Social History Main Topics  . Smoking status: Never Smoker  . Smokeless tobacco: Never Used  . Alcohol use 0.0 oz/week     Comment: Only on occasion  . Drug use: No  . Sexual activity: Not Asked   Other Topics Concern  . None   Social History Narrative   He is a married father of 2, grandfather of 1.  He is very active but not with routine exercise. He previously was walking 2to 3 miles a day and he is trying to get back to that. An occasional social alcoholic drink but does not smoke.       He recently went on a trip with his daughter and grandson to [place] and was able to walk all throughout [place] without any problems.    family history includes Heart disease in his mother; Stroke (age of onset: 7) in his mother.  Wt Readings from Last 3 Encounters:  09/21/16 193 lb (87.5 kg)  03/12/16 195 lb 3.2 oz (88.5 kg)  09/19/15 186 lb (84.4 kg)    PHYSICAL EXAM BP (!) 192/80   Pulse (!) 51   Ht 5\' 11"  (1.803 m)   Wt 193 lb (87.5 kg)   BMI 26.92 kg/m  Physical Exam  Constitutional: He is oriented to person, place, and time. He appears well-developed and well-nourished. No distress.  HENT:  Head: Normocephalic and atraumatic.    Mouth/Throat: No oropharyngeal exudate.  Eyes: EOM are normal.  Neck: Normal range of motion. Neck supple. No hepatojugular reflux and no JVD present.  Cardiovascular: Normal rate, regular rhythm, normal heart sounds and intact distal pulses.  PMI is not displaced.  Exam reveals no gallop and no friction rub.   No murmur heard. Pulmonary/Chest: Effort normal and breath sounds normal. No respiratory distress. He has no wheezes. He has no rales.  Abdominal: Soft. Bowel sounds are normal. He exhibits no distension. There is no tenderness. There is no rebound.  Musculoskeletal: Normal range of motion. He exhibits no edema.  Ulcer no longer present -   Neurological: He is alert and oriented to  person, place, and time.  Skin: Skin is warm and dry. No rash noted. No erythema.  Psychiatric: He has a normal mood and affect. His behavior is normal. Judgment and thought content normal.  Nursing note and vitals reviewed.    Adult ECG Report  Rate: 51 ;  Rhythm: sinus bradycardia; 1st Deg AVB (PR 274)  Narrative Interpretation: stable  Other studies Reviewed: Additional studies/ records that were reviewed today include:  Recent Labs:  n/a  ASSESSMENT / PLAN: Problem List Items Addressed This Visit    Dyslipidemia, goal LDL below 100 (Chronic)    On rosuvastatin - labs followed by PCP.      Essential hypertension (Chronic)    Often elevated blood pressures and clinic visits, but he brings with him a list of his home recordings all In the 125-135 SBP range.  Based on his home recordings, and his PCP Recording of 128/70 mmHg, will simply continue current Rx.   He is only on Toprol & Dyazid - could I said use additional med, but did not tolerate ARB or amlodipine.      Relevant Orders   EKG 12-Lead   H/O hematuria    Thought to be related to kidney stones. Has not had any further episodes.      PAF (paroxysmal atrial fibrillation) ([facility]): CHA2DS2Vasc Score 3, On Eliquis - Primary (Chronic)     Overall, he seems you're doing very well with current plan.Only one breakthrough episode in the last six months, treated appropriately with PRN high dose Flecainide. Remain stable on current medications with Toprol and flecainide. No bleeding issues on Eliquis for full anticoagulation.      Relevant Orders   EKG 12-Lead      Current medicines are reviewed at length with the patient today. (+/- concerns) n/a The following changes have been made: n/a  Patient Instructions  No change with current medication and treatment.   Your physician recommends that you schedule a follow-up appointment in 6 months with Mr. Eulas Post.    Your physician wants you to follow-up in 12 months with Dr Ellyn Hack. You will receive a reminder letter in the mail two months in advance. If you don't receive a letter, please call our office to schedule the follow-up appointment.  If you need a refill on your cardiac medications before your next appointment, please call your pharmacy.    Studies Ordered:   Orders Placed This Encounter  Procedures  . EKG 12-Lead      Glenetta Hew, M.D., M.S. Interventional Cardiologist   Pager # [phone] Phone # [phone] [id number] [place], [place] [address]

## 2016-09-23 ENCOUNTER — Encounter: Payer: Self-pay | Admitting: Cardiology

## 2016-09-23 NOTE — Assessment & Plan Note (Signed)
Often elevated blood pressures and clinic visits, but he brings with him a list of his home recordings all In the 125-135 SBP range.  Based on his home recordings, and his PCP Recording of 128/70 mmHg, will simply continue current Rx.   He is only on Toprol & Dyazid - could I said use additional med, but did not tolerate ARB or amlodipine.

## 2016-09-23 NOTE — Assessment & Plan Note (Signed)
On rosuvastatin - labs followed by PCP.

## 2016-09-23 NOTE — Assessment & Plan Note (Signed)
Thought to be related to kidney stones. Has not had any further episodes.

## 2016-09-23 NOTE — Assessment & Plan Note (Signed)
Overall, he seems you're doing very well with current plan.Only one breakthrough episode in the last six months, treated appropriately with PRN high dose Flecainide. Remain stable on current medications with Toprol and flecainide. No bleeding issues on Eliquis for full anticoagulation.

## 2017-03-20 ENCOUNTER — Ambulatory Visit: Payer: Medicare Other | Admitting: Physician Assistant

## 2017-03-21 ENCOUNTER — Encounter: Payer: Self-pay | Admitting: Physician Assistant

## 2017-03-21 ENCOUNTER — Ambulatory Visit (INDEPENDENT_AMBULATORY_CARE_PROVIDER_SITE_OTHER): Payer: Medicare Other | Admitting: Physician Assistant

## 2017-03-21 VITALS — BP 180/82 | HR 60 | Ht 72.0 in | Wt 197.2 lb

## 2017-03-21 DIAGNOSIS — N183 Chronic kidney disease, stage 3 unspecified: Secondary | ICD-10-CM

## 2017-03-21 DIAGNOSIS — I8390 Asymptomatic varicose veins of unspecified lower extremity: Secondary | ICD-10-CM

## 2017-03-21 DIAGNOSIS — E785 Hyperlipidemia, unspecified: Secondary | ICD-10-CM | POA: Diagnosis not present

## 2017-03-21 DIAGNOSIS — I10 Essential (primary) hypertension: Secondary | ICD-10-CM | POA: Diagnosis not present

## 2017-03-21 DIAGNOSIS — I480 Paroxysmal atrial fibrillation: Secondary | ICD-10-CM

## 2017-03-21 MED ORDER — FLECAINIDE ACETATE 100 MG PO TABS: 100.0000 mg | ORAL_TABLET | Freq: Two times a day (BID) | ORAL | 3 refills | Status: DC

## 2017-03-21 NOTE — Patient Instructions (Addendum)
Medication Instructions:  INCREASE- Flecainide 100 mg twice a day  If you need a refill on your cardiac medications before your next appointment, please call your pharmacy.  Labwork: None Ordered   Testing/Procedures: None Ordered  Follow-Up: Your physician recommends that you schedule a follow-up appointment in: 2-3 weeks EKG check  Your physician wants you to follow-up in: 6 Months with Dr Ellyn Hack. You should receive a reminder letter in the mail two months in advance. If you do not receive a letter, please call our office [phone].     Thank you for choosing [facility] at [facility]!!
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
## 2017-04-01 ENCOUNTER — Telehealth: Payer: Self-pay | Admitting: Cardiology

## 2017-04-01 NOTE — Telephone Encounter (Signed)
Left message to call back  

## 2017-04-01 NOTE — Telephone Encounter (Signed)
New Message:    Pt c/o medication issue:  1. Name of Medication: flecainide (TAMBOCOR) 100 MG tablet  2. How are you currently taking this medication (dosage and times per day)?  Take 1 tablet (100 mg total) by mouth 2 (two) times daily. 3. Are you having a reaction (difficulty breathing--STAT)? No  4. What is your medication issue? Pt states he is having SOB/dizziness/fatigue. Pt states he went back to taking 50 mg am and 50 mg in the pm. Pt states he feels better now. Pt states you can call him to discuss this issue or he can wait until his appt next week.

## 2017-04-02 NOTE — Telephone Encounter (Signed)
Follow up  ° ° °Patient is returning call. Please call to discuss.  °

## 2017-04-02 NOTE — Telephone Encounter (Signed)
Spoke with pt and per pt did not tolerate the increase in Flecainide at 100 mg bid .Pt returned to previous dose and feels better.Will forward to Dr Ellyn Hack for review .Adonis Housekeeper

## 2017-04-03 NOTE — Telephone Encounter (Signed)
LEFT MESSAGE TO CALL BACK

## 2017-04-03 NOTE — Telephone Encounter (Signed)
OK --  Continue 50 mg bid.  But, for breakthrough Afib - still do the high dose - total 200 mg dose/ day until it breaks.  Glenetta Hew, MD

## 2017-04-04 MED ORDER — FLECAINIDE ACETATE 100 MG PO TABS: 50.0000 mg | ORAL_TABLET | Freq: Two times a day (BID) | ORAL | 3 refills | Status: DC

## 2017-04-04 NOTE — Telephone Encounter (Signed)
SPOKE TO PATIENT. HE IS AWARE TO DECREASE TO 50 MG DAILY ( 1/2/TABLETOF 100 MG 0 TWICE A DAY. IF PATIENT HAS A BREAKTHROUGH OF AFIB  WILL INCREASE TO 100 MG TWICE A DAY UNTIL IT BREAKS THEN RETURN TO 50 MG TWICE A DAY.  PATIENT ALREADY HAS AN APPOINTMENT FOR EKG FOR NEXT WEEK .

## 2017-04-08 ENCOUNTER — Ambulatory Visit (INDEPENDENT_AMBULATORY_CARE_PROVIDER_SITE_OTHER): Payer: Medicare Other | Admitting: *Deleted

## 2017-04-08 DIAGNOSIS — I480 Paroxysmal atrial fibrillation: Secondary | ICD-10-CM

## 2017-04-08 NOTE — Progress Notes (Signed)
1.) Reason for visit: EKG  2.) Name of MD requesting visit: H Meng PA         3.)  H&P: Patient seen by Janan Ridge PA  on 03/21/17 and was scheduled for  follow up EKG. His Flecainide was  increased to 100 mg twice a day at  visit. On 04/01/17 patient called in  Flecainide was reduced back to 50  mg twice a day secondary to  SOB/dizziness/fatigue. EKG done  today and patient remains in Afib  with HR 55. Patient states he feels  good. Reviewed by Doreene Burke PA.  Advised patient to continue same  dose of medications and will call if  anything further recommended  once reveiwed by Janan Ridge PA/Dr  Ellyn Hack

## 2017-04-11 ENCOUNTER — Other Ambulatory Visit: Payer: Self-pay | Admitting: Physician Assistant

## 2017-04-11 NOTE — Telephone Encounter (Signed)
REFILL 

## 2017-04-11 NOTE — Telephone Encounter (Signed)
Refill Request.  

## 2017-05-08 ENCOUNTER — Telehealth: Payer: Self-pay | Admitting: Cardiology

## 2017-05-08 NOTE — Telephone Encounter (Signed)
Received patient call regarding medication refill for Eliquis 5 mg p.o. twice daily.  Patient states that he has requested the medication refill several times from the office.  I do not see these encounters.  He states that he is completely out of his medication currently.  Personally called patient's pharmacy for refill request.  Spoke with pharmacist who will fill prescription at this time.  Patient has follow-up with Dr. Ellyn Hack soon.  Kathyrn Drown NP-C [place] Pager: [phone]

## 2017-05-10 ENCOUNTER — Other Ambulatory Visit: Payer: Self-pay | Admitting: Pharmacist

## 2017-05-10 MED ORDER — APIXABAN 5 MG PO TABS: 5.0000 mg | ORAL_TABLET | Freq: Two times a day (BID) | ORAL | 1 refills | Status: DC

## 2018-01-09 ENCOUNTER — Encounter (INDEPENDENT_AMBULATORY_CARE_PROVIDER_SITE_OTHER): Payer: Self-pay

## 2018-01-09 ENCOUNTER — Encounter: Payer: Self-pay | Admitting: Cardiology

## 2018-01-09 ENCOUNTER — Ambulatory Visit (INDEPENDENT_AMBULATORY_CARE_PROVIDER_SITE_OTHER): Payer: Medicare Other | Admitting: Cardiology

## 2018-01-09 VITALS — BP 122/82 | HR 58 | Ht 71.0 in | Wt 200.2 lb

## 2018-01-09 DIAGNOSIS — I4811 Longstanding persistent atrial fibrillation: Secondary | ICD-10-CM | POA: Diagnosis not present

## 2018-01-09 DIAGNOSIS — E785 Hyperlipidemia, unspecified: Secondary | ICD-10-CM | POA: Diagnosis not present

## 2018-01-09 DIAGNOSIS — Z8719 Personal history of other diseases of the digestive system: Secondary | ICD-10-CM | POA: Diagnosis not present

## 2018-01-09 DIAGNOSIS — I10 Essential (primary) hypertension: Secondary | ICD-10-CM

## 2018-01-09 MED ORDER — FLECAINIDE ACETATE 100 MG PO TABS: ORAL_TABLET | ORAL | 0 refills | Status: DC

## 2018-01-09 NOTE — Progress Notes (Signed)
PCP: Deland Pretty, MD  Clinic Note: Chief Complaint  Patient presents with  . Follow-up    A little bit more fatigue and dyspnea with exertion, but otherwise stable  . Atrial Fibrillation    HPI: Joseph Landry is a 78 y.o. 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  He now presents for essentially annual follow-up having last been seen by Almyra Deforest in March or 2019.   He was converted from Pradaxa to Eliquis in December 2016.  I last saw him in September 2018.  HICKS FEICK was last seen on March 21, 2017 by Almyra Deforest, PA.  Denied any breakthrough episodes of A. fib at that time.  Noted that he was not taking his lisinopril due to dizziness.  Was noted to be in A. fib at the time and was told to increase flecainide up to 100 mg mg twice daily and return for EKG into the 2 to 3 weeks.  Dose was reduced back to 50 mg twice daily.  --  EKG follow-up still showed A. fib with heart rate of 55 bpm.  Plan at that time was just simply to continue rate control.  Recent Hospitalizations: None  Studies Personally Reviewed - (if available, images/films reviewed: From Epic Chart or Care Everywhere)  None  Interval History: Joseph Landry returns today overall feeling relatively stable.  He has no sensation whatsoever of irregular heartbeat.  He has not had any episodes of rapid A. fib since he was last seen.  He tells me that his blood pressures at home tend to be somewhere in the 110/70 in the morning and 130s over 70s in the evenings.  But otherwise no real hypertension episodes.  He and his wife recently moved into an apartment and this caused quite a bit of adjustment in their lifestyle and he had gotten out of his routine habit of going to the gym.  The plan was for him to get back into his exercise, but he never really  did.  As such she is somewhat deconditioned.  He now notes that he feels little more tired than usual limit more short of breath than he used to.  He used to build to do his full yard work for couple hours and not at rest, but now at the rest after an hour or so.  He does however note that he is just not as active as he used to be.  He still works cutting hair, and has occasional end of day edema but nothing significant.  Relatively controlled with Dyazide.  From a cardiac standpoint he is otherwise relatively stable. No chest tightness pressure with rest or exertion.  No resting dyspnea, but does have exertional dyspnea when he overexerts.  A little bit more tired than usual. Remainder of cardiac review of symptoms: No PND, orthopnea or edema.  No palpitations, lightheadedness, dizziness, weakness or syncope/near syncope. No TIA/amaurosis fugax symptoms. No claudication.  ROS: A comprehensive was performed. Review of Systems  Constitutional: Positive for malaise/fatigue (Overall less energy than he used to have). Negative for weight loss (Actual weight gain since he is been less active).  HENT: Negative for congestion, nosebleeds and sinus pain.   Respiratory: Positive for shortness of breath (See HPI).   Gastrointestinal: Negative for blood in stool and melena.  Genitourinary: Negative for hematuria.  Musculoskeletal: Negative  for falls.  Neurological: Negative for dizziness, focal weakness, weakness and headaches.  Endo/Heme/Allergies: Does not bruise/bleed easily.  Psychiatric/Behavioral: Negative for memory loss. The patient is not nervous/anxious and does not have insomnia.    I have reviewed and (if needed) personally updated the patient's problem list, medications, allergies, past medical and surgical history, social and family history.   Past Medical History:  Diagnosis Date  . Arthritis   . Dyslipidemia     on  simvastatin   . Essential hypertension    Labile; often elevated  in clinic visits, but never at home  . PAF (paroxysmal atrial fibrillation) (HCC)    Rate controlled with beta blocker; rhythm control with flecainide; anticoagulation with Eliquis  . Ulcer of ankle (HCC)    right ankle; s/p I&D  . Varicose veins    s/p R GSFA Endovenous Ablation  . Wears glasses   . Wears hearing aid    both ears  . Wears partial dentures     Past Surgical History:  Procedure Laterality Date  . APPLICATION OF A-CELL OF EXTREMITY Right 12/31/2013   Procedure: PLACEMENT OF ACELL RIGHT MEDIAL ANKLE;  Surgeon: Theodoro Kos, DO;  Location: [facility];  Service: Plastics;  Laterality: Right;  . COLONOSCOPY    . ENDOVENOUS ABLATION SAPHENOUS VEIN W/ LASER Right 01-14-2014   EVLA right greater saphenous vein by Curt Jews MD  . event monitor  08/22/2011-09/20/2011   shows a fib- startedon FLECAINDE  . I&D EXTREMITY Right 12/31/2013   Procedure: IRRIGATION AND DEBRIDEMENT RIGHT ANKLE WOUND;  Surgeon: Theodoro Kos, DO;  Location: [facility];  Service: Plastics;  Laterality: Right;  . NASAL SINUS SURGERY    . NM MYOVIEW LTD - Persantine  August 2012   No infarct or ischemia.  Domingo Dimes ECHOCARDIOGRAM  August 2012   Normal LV size and function, normal LA size, EF> 55%.    Current Meds  Medication Sig  . apixaban (ELIQUIS) 5 MG TABS tablet Take 1 tablet (5 mg total) by mouth 2 (two) times daily.  . flecainide (TAMBOCOR) 100 MG tablet May use as needed. - take 100 mg 1st day , then 100 mg twice a day until heart rate decrease  . folic acid (FOLVITE) 683 MCG tablet Take 400 mcg by mouth daily.   . metoprolol succinate (TOPROL-XL) 100 MG 24 hr tablet Take 1 tablet by mouth daily.  . Omega-3 Fatty Acids (FISH OIL) 1000 MG CAPS Take by mouth.  . rosuvastatin (CRESTOR) 20 MG tablet Take 20 mg by mouth daily.  Marland Kitchen triamterene-hydrochlorothiazide (DYAZIDE) 37.5-25 MG per capsule Take 1 capsule by mouth daily.  . [DISCONTINUED] flecainide  (TAMBOCOR) 100 MG tablet TAKE 1/2 TABLET(50 MG) BY MOUTH TWICE DAILY    Allergies  Allergen Reactions  . Multaq [Dronedarone] Nausea Only    Made pt feel bad and worn out     Social History   Tobacco Use  . Smoking status: Never Smoker  . Smokeless tobacco: Never Used  Substance Use Topics  . Alcohol use: Yes    Alcohol/week: 0.0 standard drinks    Comment: Only on occasion  . Drug use: No   Social History   Social History Narrative   He is a married father of 2, grandfather of 1.  He is very active but not with routine exercise. He previously was walking 2to 3 miles a day and he is trying to get back to that. An occasional social alcoholic drink but does  not smoke.       He and his wife recently moved into apartment last winter.  This caused him to get out of his exercise routine, and he is not yet gotten back into it.   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    family history includes Heart disease in his mother; Stroke (age of onset: 27) in his mother.  Wt Readings from Last 3 Encounters:  01/09/18 200 lb 3.2 oz (90.8 kg)  03/21/17 197 lb 3.2 oz (89.4 kg)  09/21/16 193 lb (87.5 kg)    PHYSICAL EXAM BP 122/82   Pulse (!) 58   Ht 5\' 11"  (1.803 m)   Wt 200 lb 3.2 oz (90.8 kg)   BMI 27.92 kg/m  Physical Exam  Constitutional: He is oriented to person, place, and time. He appears well-developed and well-nourished. No distress.  Healthy-appearing.  Well-groomed.  In good spirits  HENT:  Head: Normocephalic and atraumatic.  Mouth/Throat: Oropharynx is clear and moist.  Neck: Normal range of motion. Neck supple. No hepatojugular reflux and no JVD (Mild cannon A waves from A. fib) present. Carotid bruit is not present.  Cardiovascular: Normal heart sounds, intact distal pulses and normal pulses. An irregularly irregular rhythm present. Bradycardia present. PMI is not displaced. Exam reveals no gallop and no S4.  No murmur heard. Pulmonary/Chest: Effort normal and  breath sounds normal. No respiratory distress. He has no wheezes. He has no rales.  Musculoskeletal: Normal range of motion.        General: Edema (Trivial pedal bilateral) present.  Neurological: He is alert and oriented to person, place, and time.  Skin:  No venous stasis changes  Psychiatric: His behavior is normal. Judgment and thought content normal.  Vitals reviewed.    Adult ECG Report  Rate: 58 ;  Rhythm: atrial fibrillation and Right axis deviation (100 degrees).  Otherwise normal intervals durations.;   Narrative Interpretation: Relatively stable EKG.  Remains in A. fib.   Other studies Reviewed: Additional studies/ records that were reviewed today include:  Recent Labs:    K PN dated 05/13/2017: TC 120, TG 112, HDL 33, LDL 65.  Creatinine 1.5, potassium 4.1.  Hemoglobin 13.3.  TSH 2.75.  Platelets 156.   ASSESSMENT / PLAN: Problem List Items Addressed This Visit    Dyslipidemia, goal LDL below 100 (Chronic)    Most recent LDL is well within goal on current dose of rosuvastatin.  Continue without change.  Continue fish oil as well.      Relevant Medications   flecainide (TAMBOCOR) 100 MG tablet   Essential hypertension (Chronic)    His blood pressures been relatively stable.  A little bit lower in the morning than in the evening.  At this point I think he is pretty much well controlled on his current dose of metoprolol succinate and Dyazide  Did not tolerate either amlodipine or ARB in the past.      Relevant Medications   flecainide (TAMBOCOR) 100 MG tablet   H/O: GI bleed    Has had no further bleeding episodes on Eliquis.  Continue to monitor.  Low threshold to hold temporarily, if he were to have a recurrent bleed.      Longstanding persistent atrial fibrillation:  CHA2DS2Vasc Score 3, On Eliquis - Primary (Chronic)    He now is apparently in chronic persistent atrial fibrillation.  Has been in atrial fibrillation the last couple has been evaluated.   Relatively asymptomatic.  Rate is  relatively well controlled although he has noted some exertional fatigue and dyspnea.  He may have some mild chronotropic incompetence. At this time since he is pretty much always in A. fib I do not think that there is benefit from maintaining him on flecainide.    Plan: Continue current dose of metoprolol.  We will wean him off flecainide -as per instructions below.  We will only use PRN flecainide for breakthrough tachycardia episodes.  Continue Eliquis.      Relevant Medications   flecainide (TAMBOCOR) 100 MG tablet   Other Relevant Orders   EKG 12-Lead (Completed)       I spent a total of 44minutes with the patient and chart review. >  50% of the time was spent in direct patient consultation.   Current medicines are reviewed at length with the patient today.  (+/- concerns) none  The following changes have been made:  wean off Felcainide  Patient Instructions  Medication Instructions:  Wean off FLECAINIDE-- TAKE 1/2 TABLET ONCE A DAY FOR 7 DAYS THEN STOP MEDICATION  ONLY USE FLECAINIDE  IF NEEDED  FOR INCREASE HEART RATE INSTRUCTIONS-- TAKE 100 MG  THE FIRST DAY ,THEN 100 MG TWICE A DY  FOR @ 2- 3 DAYS UNTIL HEART RATE BECOMES REGULAR .   If you need a refill on your cardiac medications before your next appointment, please call your pharmacy.   Lab work: NOT NEEDED If you have labs (blood work) drawn today and your tests are completely normal, you will receive your results only by: Marland Kitchen MyChart Message (if you have MyChart) OR . A paper copy in the mail If you have any lab test that is abnormal or we need to change your treatment, we will call you to review the results.  Testing/Procedures: NOT NEEDED  Follow-Up: At [facility], you and your health needs are our priority.  As part of our continuing mission to provide you with exceptional heart care, we have created designated Provider Care Teams.  These Care Teams include your  primary Cardiologist (physician) and Advanced Practice Providers (APPs -  Physician Assistants and Nurse Practitioners) who all work together to provide you with the care you need, when you need it. You will need a follow up appointment in 12 months JAN 2021.  Please call our office 2 months in advance to schedule this appointment.  You may see Glenetta Hew, MD or one of the following Advanced Practice Providers on your designated Care Team:   Rosaria Ferries, PA-C . Jory Sims, DNP, ANP  Any Other Special Instructions Will Be Listed Below (If Applicable).      Studies Ordered:   Orders Placed This Encounter  Procedures  . EKG 12-Lead      Glenetta Hew, M.D., M.S. Interventional Cardiologist   Pager # [phone] Phone # [phone] [address]. [place], [place] [address]   Thank you for choosing Heartcare at [facility]!!

## 2018-01-09 NOTE — Patient Instructions (Signed)
Medication Instructions:  Wean off FLECAINIDE-- TAKE 1/2 TABLET ONCE A DAY FOR 7 DAYS THEN STOP MEDICATION  ONLY USE FLECAINIDE  IF NEEDED  FOR INCREASE HEART RATE INSTRUCTIONS-- TAKE 100 MG  THE FIRST DAY ,THEN 100 MG TWICE A DY  FOR @ 2- 3 DAYS UNTIL HEART RATE BECOMES REGULAR .   If you need a refill on your cardiac medications before your next appointment, please call your pharmacy.   Lab work: NOT NEEDED If you have labs (blood work) drawn today and your tests are completely normal, you will receive your results only by: Marland Kitchen MyChart Message (if you have MyChart) OR . A paper copy in the mail If you have any lab test that is abnormal or we need to change your treatment, we will call you to review the results.  Testing/Procedures: NOT NEEDED  Follow-Up: At [facility], you and your health needs are our priority.  As part of our continuing mission to provide you with exceptional heart care, we have created designated Provider Care Teams.  These Care Teams include your primary Cardiologist (physician) and Advanced Practice Providers (APPs -  Physician Assistants and Nurse Practitioners) who all work together to provide you with the care you need, when you need it. You will need a follow up appointment in 12 months JAN 2021.  Please call our office 2 months in advance to schedule this appointment.  You may see Glenetta Hew, MD or one of the following Advanced Practice Providers on your designated Care Team:   Rosaria Ferries, PA-C . Jory Sims, DNP, ANP  Any Other Special Instructions Will Be Listed Below (If Applicable).

## 2018-01-12 ENCOUNTER — Encounter: Payer: Self-pay | Admitting: Cardiology

## 2018-01-12 NOTE — Assessment & Plan Note (Signed)
His blood pressures been relatively stable.  A little bit lower in the morning than in the evening.  At this point I think he is pretty much well controlled on his current dose of metoprolol succinate and Dyazide  Did not tolerate either amlodipine or ARB in the past.

## 2018-01-12 NOTE — Assessment & Plan Note (Signed)
Most recent LDL is well within goal on current dose of rosuvastatin.  Continue without change.  Continue fish oil as well.

## 2018-01-12 NOTE — Assessment & Plan Note (Signed)
He now is apparently in chronic persistent atrial fibrillation.  Has been in atrial fibrillation the last couple has been evaluated.  Relatively asymptomatic.  Rate is relatively well controlled although he has noted some exertional fatigue and dyspnea.  He may have some mild chronotropic incompetence. At this time since he is pretty much always in A. fib I do not think that there is benefit from maintaining him on flecainide.    Plan: Continue current dose of metoprolol.  We will wean him off flecainide -as per instructions below.  We will only use PRN flecainide for breakthrough tachycardia episodes.  Continue Eliquis.

## 2018-01-12 NOTE — Assessment & Plan Note (Signed)
Has had no further bleeding episodes on Eliquis.  Continue to monitor.  Low threshold to hold temporarily, if he were to have a recurrent bleed.

## 2018-03-13 ENCOUNTER — Other Ambulatory Visit: Payer: Self-pay | Admitting: Cardiology

## 2018-03-14 NOTE — Telephone Encounter (Signed)
Pt saw Dr. Ellyn Hack on 01/09/18, weight at that visit was 90.8Kg. Last noted SCr was 1.5 on 05/13/17-[facility]. Will refill Eliquis 5mg  BID.

## 2018-06-10 ENCOUNTER — Ambulatory Visit
Admission: RE | Admit: 2018-06-10 | Discharge: 2018-06-10 | Disposition: A | Discharge status: Home / Self Care | Payer: Medicare Other | Source: Ambulatory Visit | Attending: Nephrology | Admitting: Nephrology

## 2018-06-10 ENCOUNTER — Other Ambulatory Visit: Payer: Self-pay | Admitting: Nephrology

## 2018-06-10 DIAGNOSIS — N184 Chronic kidney disease, stage 4 (severe): Secondary | ICD-10-CM

## 2018-06-16 ENCOUNTER — Other Ambulatory Visit: Payer: Self-pay | Admitting: Cardiology

## 2018-06-16 DIAGNOSIS — I4811 Longstanding persistent atrial fibrillation: Secondary | ICD-10-CM

## 2018-06-16 NOTE — Telephone Encounter (Signed)
Pt is a 77yom wt of 90.8kg, scr of 1.5(05/13/17), lov w/Dr. Ellyn Hack (01/09/18) I would refill for one month then add a note to pharmacy saying labs are needed for further refills and order labs

## 2018-06-17 NOTE — Telephone Encounter (Signed)
Labs drawn 06/16/2018, not yet resulted. Will refill for now

## 2018-06-25 LAB — COMPREHENSIVE METABOLIC PANEL
ALT: 26 IU/L (ref 0–44)
AST: 31 IU/L (ref 0–40)
Albumin/Globulin Ratio: 1.6 (ref 1.2–2.2)
Albumin: 4.5 g/dL (ref 3.7–4.7)
Alkaline Phosphatase: 77 IU/L (ref 39–117)
BUN/Creatinine Ratio: 18 (ref 10–24)
BUN: 35 mg/dL — ABNORMAL HIGH (ref 8–27)
Bilirubin Total: 0.8 mg/dL (ref 0.0–1.2)
CO2: 23 mmol/L (ref 20–29)
Calcium: 10 mg/dL (ref 8.6–10.2)
Chloride: 101 mmol/L (ref 96–106)
Creatinine, Ser: 1.92 mg/dL — ABNORMAL HIGH (ref 0.76–1.27)
GFR calc Af Amer: 38 mL/min/{1.73_m2} — ABNORMAL LOW (ref >59)
GFR calc non Af Amer: 33 mL/min/{1.73_m2} — ABNORMAL LOW (ref >59)
Globulin, Total: 2.9 g/dL (ref 1.5–4.5)
Glucose: 97 mg/dL (ref 65–99)
Potassium: 4.6 mmol/L (ref 3.5–5.2)
Sodium: 139 mmol/L (ref 134–144)
Total Protein: 7.4 g/dL (ref 6.0–8.5)

## 2018-06-27 ENCOUNTER — Telehealth: Payer: Self-pay | Admitting: *Deleted

## 2018-06-27 DIAGNOSIS — R799 Abnormal finding of blood chemistry, unspecified: Secondary | ICD-10-CM

## 2018-06-27 DIAGNOSIS — R7989 Other specified abnormal findings of blood chemistry: Secondary | ICD-10-CM

## 2018-06-27 DIAGNOSIS — R899 Unspecified abnormal finding in specimens from other organs, systems and tissues: Secondary | ICD-10-CM

## 2018-06-27 NOTE — Telephone Encounter (Signed)
Left message to call back  Need to give result labs palced

## 2018-06-27 NOTE — Telephone Encounter (Signed)
-----   Message from Leonie Man, MD sent at 06/25/2018  5:46 PM EDT ----- Chemistry panel shows kidney function is a little bit worse.  Looks like he is little dehydrated.  Would recommend stopping the Dyazide for ~1 week -- increase PO hydration (~8-10 10 oz glasses of water / day). Recheck BNP early next week.  Glenetta Hew, MD

## 2018-06-30 NOTE — Telephone Encounter (Signed)
Patient is returning call.  °

## 2018-07-01 NOTE — Telephone Encounter (Signed)
RN contacted  Dr Ellyn Hack, information given of patient seeing nephrologist.  Last office notes obtained for Dr Ellyn Hack to review once in the office. Per Dr Ellyn Hack - would  Like patient to follow  previous direction.once labs are in will  Forward to  Nephrologist to manage. Patient aware

## 2018-07-01 NOTE — Telephone Encounter (Signed)
Spoke to patient,  Information given . Patient states since he last saw Joseph Landry , primary Joseph Joseph Landry - has referred patient to nephrology - Joseph Landry.  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  PATIENT AWARE RN WILL INFORMED  Joseph HARDING OF THE ABOVE INFORMATION and contact  With any up dates.

## 2018-07-16 LAB — BASIC METABOLIC PANEL
BUN/Creatinine Ratio: 16 (ref 10–24)
BUN: 23 mg/dL (ref 8–27)
CO2: 22 mmol/L (ref 20–29)
Calcium: 9.3 mg/dL (ref 8.6–10.2)
Chloride: 105 mmol/L (ref 96–106)
Creatinine, Ser: 1.42 mg/dL — ABNORMAL HIGH (ref 0.76–1.27)
GFR calc Af Amer: 54 mL/min/{1.73_m2} — ABNORMAL LOW (ref >59)
GFR calc non Af Amer: 47 mL/min/{1.73_m2} — ABNORMAL LOW (ref >59)
Glucose: 104 mg/dL — ABNORMAL HIGH (ref 65–99)
Potassium: 4.5 mmol/L (ref 3.5–5.2)
Sodium: 140 mmol/L (ref 134–144)

## 2018-07-21 ENCOUNTER — Other Ambulatory Visit: Payer: Self-pay | Admitting: Pharmacist Clinician (PhC)/ Clinical Pharmacy Specialist

## 2018-07-22 NOTE — Telephone Encounter (Signed)
Refill Request.  

## 2018-07-22 NOTE — Telephone Encounter (Signed)
51m 90.8kg Scr 1.42(07/16/18) Lovw/harding(01/09/18)

## 2018-07-31 ENCOUNTER — Other Ambulatory Visit: Payer: Self-pay

## 2018-10-23 ENCOUNTER — Other Ambulatory Visit: Payer: Self-pay | Admitting: Cardiology

## 2018-10-27 NOTE — Telephone Encounter (Signed)
Refill request

## 2018-11-25 ENCOUNTER — Other Ambulatory Visit: Payer: Self-pay

## 2019-01-26 ENCOUNTER — Telehealth: Payer: Self-pay | Admitting: Cardiology

## 2019-01-26 ENCOUNTER — Other Ambulatory Visit: Payer: Self-pay | Admitting: Cardiology

## 2019-01-26 NOTE — Telephone Encounter (Signed)
Pt c/o BP issue: STAT if pt c/o blurred vision, one-sided weakness or slurred speech  1. What are your last 5 BP readings?   176/93  (No other readings documented) 2. Are you having any other symptoms (ex. Dizziness, headache, blurred vision, passed out)? No  3. What is your BP issue? BP has been elevated

## 2019-01-26 NOTE — Telephone Encounter (Signed)
Returned call to pt he states that he has been having high BP for the last couple days to a week. He states that it has been running 176/93 HR 61 this morning and then last night 194/114. He states that he has not been logging his BP readings but recalls that "it was in the 184, 1168/100's then 114. He denies any sx  Dizziness, headache, blurred vision,  or CP. He states that he has some "head pressure" but would not call it a headache. He states that he will start to log BP's and go to the ER if needed. He will call back tomorrow if BP is still elevated with the numbers that he has today.  Are there any changes that you would like to do? Please advise

## 2019-01-27 MED ORDER — AMLODIPINE BESYLATE 5 MG PO TABS: 5.0000 mg | ORAL_TABLET | Freq: Every day | ORAL | 1 refills | Status: DC

## 2019-01-27 NOTE — Telephone Encounter (Signed)
Left message to call back  

## 2019-01-27 NOTE — Telephone Encounter (Signed)
Have patient start amlodipine 5 mg daily and come to [facility] in 3-4 wks for follow up

## 2019-01-27 NOTE — Telephone Encounter (Signed)
Advised patient, verbalized understanding Follow up with Pharm D scheduled

## 2019-01-27 NOTE — Telephone Encounter (Signed)
Pt c/o BP issue: STAT if pt c/o blurred vision, one-sided weakness or slurred speech  1. What are your last 5 BP readings?  176/93 HR 61 194/97 HR 51 174/98 HR 67 171/97 HR 61  2. Are you having any other symptoms (ex. Dizziness, headache, blurred vision, passed out)?  Pressure in head   3. What is your BP issue?  After speaking with the nurse yesterday 01/26/19 he was advised to callback today 01/27/19 with BP readings

## 2019-01-27 NOTE — Telephone Encounter (Signed)
Will forward to Dr Ellyn Hack and Sherian Rein D to review

## 2019-01-28 NOTE — Telephone Encounter (Signed)
That sounds like a great plan  dh

## 2019-02-17 ENCOUNTER — Other Ambulatory Visit: Payer: Self-pay

## 2019-02-17 ENCOUNTER — Ambulatory Visit (INDEPENDENT_AMBULATORY_CARE_PROVIDER_SITE_OTHER): Payer: Medicare Other | Admitting: Pharmacist

## 2019-02-17 VITALS — BP 140/82 | HR 60 | Ht 71.0 in | Wt 194.6 lb

## 2019-02-17 DIAGNOSIS — I10 Essential (primary) hypertension: Secondary | ICD-10-CM

## 2019-02-17 NOTE — Progress Notes (Deleted)
HTN

## 2019-02-17 NOTE — Patient Instructions (Signed)
Return for a  follow up appointment AS NEEDED  Check your blood pressure at home daily (if able) and keep record of the readings.  Take your BP meds as follows: *NO MEDICATION CHANGES*  Bring all of your meds, your BP cuff and your record of home blood pressures to your next appointment.  Exercise as you're able, try to walk approximately 30 minutes per day.  Keep salt intake to a minimum, especially watch canned and prepared boxed foods.  Eat more fresh fruits and vegetables and fewer canned items.  Avoid eating in fast food restaurants.    HOW TO TAKE YOUR BLOOD PRESSURE: . Rest 5 minutes before taking your blood pressure. .  Don't smoke or drink caffeinated beverages for at least 30 minutes before. . Take your blood pressure before (not after) you eat. . Sit comfortably with your back supported and both feet on the floor (don't cross your legs). . Elevate your arm to heart level on a table or a desk. . Use the proper sized cuff. It should fit smoothly and snugly around your bare upper arm. There should be enough room to slip a fingertip under the cuff. The bottom edge of the cuff should be 1 inch above the crease of the elbow. . Ideally, take 3 measurements at one sitting and record the average.    

## 2019-02-17 NOTE — Assessment & Plan Note (Addendum)
Patients blood pressure in office remains slightly above goal of <130/80, however readings are significantly improved since addition of amodipine 5 mg/ daily. He does have some degree of white coat syndrome. Patient reports no edema with amlodipine. He shared that he does not relax prior to checking his BP at home. Advised to continue checking BP at home twice daily, relaxing prior to checking. BP monitor is slightly inaccurate within 10 mmHg, and detects when he is in Afib. Counseled on lifestyle modifications including limited intake of high sodium foods and to increase walking as able. Continue BP medications as prescribed. Patient is scheduled for follow up with Dr Ellyn Hack next month. No follow up with CPP needed at this time. If BP remains elevated, may consider increasing amlodipine to 7.5 mg or 10mg  daily as tolerated.

## 2019-02-17 NOTE — Progress Notes (Signed)
Patient ID: Joseph Landry                 DOB: 25-Mar-1940                      MRN: [id number]     HPI: Joseph Landry is a 79 y.o. male referred by Joseph Landry to HTN clinic. PMH includes paroxysmal Afib on metoprolol daily and flecainide prn, hypertension, hyperlipidemia, and stage 3 CKD (Scr 1.5-1.8). Followed by [facility]. Patient called clinic on 01/26/2019 with concerns of elevated blood pressures in the 170s/90s, but reported no symptoms. Amlodipine 5 mg once daily was initiated at this time. Covid positive in December. Patient has longstanding history of hypertension, although last in office reading 122/82.   Patient presents for HTN evaluation and medication titration. He has no complaints of shortness of breath, dizziness, headache, or blurred vision.   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  Diet: drinks water; cooking more at home with wife, usually eats out for lunch while at work  Exercise: active lifestyle, still working; was going the [employer] and walking, however he stopped when Covid started; has recently increased walking outside   Home BP readings: Pt brought Omron monitor: 148/100 & 142/101 in clinic; not accurate within 10 mmHg; manual readings: 140/82 & 134/78 in clinic  Provided 17 home readings since February 1; average 135/77    Wt Readings from Last 3 Encounters:  02/17/19 194 lb 9.6 oz (88.3 kg)  01/09/18 200 lb 3.2 oz (90.8 kg)  03/21/17 197 lb 3.2 oz (89.4 kg)   BP Readings from Last 3 Encounters:  02/17/19 140/82  01/09/18 122/82  03/21/17 (!) 180/82   Pulse Readings from Last 3 Encounters:  02/17/19 60  01/09/18 (!) 58  03/21/17 60      Past Medical History:  Diagnosis Date  . Arthritis   . Dyslipidemia     on  simvastatin   . Essential hypertension    Labile; often elevated in clinic visits, but never at home  . PAF (paroxysmal atrial fibrillation) (HCC)    Rate controlled with beta blocker; rhythm control with flecainide; anticoagulation with Eliquis  . Ulcer of ankle (HCC)    right ankle; s/p I&D  . Varicose veins    s/p R GSFA Endovenous Ablation  . Wears glasses   . Wears hearing aid    both ears  . Wears partial dentures     Current Outpatient Medications on File Prior to Visit  Medication Sig Dispense Refill  . Vitamin D, Cholecalciferol, 50 MCG (2000 UT) CAPS Take 1 capsule by mouth daily.    Marland Kitchen amLODipine (NORVASC) 5 MG tablet Take 1 tablet (5 mg total) by mouth daily. 90 tablet 1  . ELIQUIS 5 MG TABS tablet TAKE 1 TABLET(5 MG) BY MOUTH TWICE DAILY 180 tablet 1  . flecainide (TAMBOCOR) 100 MG tablet May use as needed. - take 100 mg 1st day , then 100 mg twice a day until heart rate decrease (Patient not taking: Reported on 02/17/2019) 90 tablet 0  . folic acid (FOLVITE) A999333 MCG tablet Take 400 mcg by mouth daily.     Marland Kitchen  metoprolol succinate (TOPROL-XL) 100 MG 24 hr tablet Take 1 tablet by mouth daily.  0  . Omega-3 Fatty Acids (FISH OIL) 1000 MG CAPS Take by mouth.    . rosuvastatin (CRESTOR) 20 MG tablet Take 20 mg by mouth daily.  5   No current facility-administered medications on file prior to visit.    Allergies  Allergen Reactions  . Multaq [Dronedarone] Nausea Only    Made pt feel bad and worn out     Blood pressure 140/82, pulse 60, height 5\' 11"  (1.803 m), weight 194 lb 9.6 oz (88.3 kg), SpO2 98 %.   Essential hypertension Patients blood pressure in office remains slightly above goal of <130/80, however readings are significantly improved since addition of amodipine 5 mg/ daily. He does have some degree of white coat syndrome. Patient reports no edema with amlodipine. He shared that he  does not relax prior to checking his BP at home. Advised to continue checking BP at home twice daily, relaxing prior to checking. BP monitor is slightly inaccurate within 10 mmHg, and detects when he is in Afib. Counseled on lifestyle modifications including limited intake of high sodium foods and to increase walking as able. Continue BP medications as prescribed. Patient is scheduled for follow up with Dr Ellyn Landry next month. No follow up with CPP needed at this time. If BP remains elevated, may consider increasing amlodipine to 7.5 mg or 10mg  daily as tolerated.  Note written by: Joseph Landry, PharmD Student   Joseph Landry PharmD, BCPS, [place] [address] [place] [address] 02/17/2019 2:10 PM

## 2019-03-23 ENCOUNTER — Ambulatory Visit (INDEPENDENT_AMBULATORY_CARE_PROVIDER_SITE_OTHER): Payer: Medicare Other | Admitting: Cardiology

## 2019-03-23 ENCOUNTER — Other Ambulatory Visit: Payer: Self-pay

## 2019-03-23 ENCOUNTER — Encounter: Payer: Self-pay | Admitting: Cardiology

## 2019-03-23 VITALS — BP 134/78 | HR 53 | Temp 96.0°F | Ht 71.0 in | Wt 191.2 lb

## 2019-03-23 DIAGNOSIS — I10 Essential (primary) hypertension: Secondary | ICD-10-CM | POA: Diagnosis not present

## 2019-03-23 DIAGNOSIS — I4821 Permanent atrial fibrillation: Secondary | ICD-10-CM

## 2019-03-23 DIAGNOSIS — E785 Hyperlipidemia, unspecified: Secondary | ICD-10-CM

## 2019-03-23 MED ORDER — AMLODIPINE BESYLATE 5 MG PO TABS: 5.0000 mg | ORAL_TABLET | Freq: Every day | ORAL | 3 refills | Status: DC

## 2019-03-23 NOTE — Assessment & Plan Note (Signed)
Has had a history of labile blood pressures.  Currently seem relatively stable at home.  His blood pressure readings at home correlate with the readings here.  He did have his blood pressure cuff validated it does seem to be accurate within 10 mmHg.  He will continue to monitor his pressures, I told him that he is fine as long as his blood pressure is not consistently over 160 mmHg.  If that occurs, he can take an additional dose of amlodipine.  But I did tell him he should recheck it in 1 hour.  Only treat if it still high

## 2019-03-23 NOTE — Assessment & Plan Note (Signed)
Labs followed by PCP.  I do not have recent labs.  He is managed on current dose of rosuvastatin.  Tolerating well.

## 2019-03-23 NOTE — Assessment & Plan Note (Signed)
Pretty much permanent A. fib.  He does not know if he is in or not, therefore I do not think we need to do flecainide.  We will remove flecainide from his med list.   Continue Eliquis at current dose.  Continue current dose metoprolol for rate control.  Monitor for signs of bradycardia.

## 2019-03-23 NOTE — Patient Instructions (Signed)
Medication Instructions:  NO CHANGES *If you need a refill on your cardiac medications before your next appointment, please call your pharmacy*   Lab Work: NOT NEEDED    Testing/Procedures: NOT NEEDED   Follow-Up: At [facility], you and your health needs are our priority.  As part of our continuing mission to provide you with exceptional heart care, we have created designated Provider Care Teams.  These Care Teams include your primary Cardiologist (physician) and Advanced Practice Providers (APPs -  Physician Assistants and Nurse Practitioners) who all work together to provide you with the care you need, when you need it.  We recommend signing up for the patient portal called "MyChart".  Sign up information is provided on this After Visit Summary.  MyChart is used to connect with patients for Virtual Visits (Telemedicine).  Patients are able to view lab/test results, encounter notes, upcoming appointments, etc.  Non-urgent messages can be sent to your provider as well.   To learn more about what you can do with MyChart, go to [email or web address].    Your next appointment:   6 month(s)  The format for your next appointment:   In Person  Provider:   You may see Glenetta Hew, MD or one of the following Advanced Practice Providers on your designated Care Team:    Rosaria Ferries, PA-C  Jory Sims, DNP, ANP  Cadence Kathlen Mody, NP

## 2019-03-23 NOTE — Progress Notes (Signed)
Primary Care Provider: Deland Pretty, MD Cardiologist: Glenetta Hew, MD Electrophysiologist: None  Clinic Note: Chief Complaint  Patient presents with   Follow-up    Doing well.  Has blood pressure log.   Atrial Fibrillation    No symptoms   Hypertension    Numbers look much better with his blood pressure log.    HPI:    Joseph Landry is a 79 y.o. male with a PMH of LONGSTANDING PAROXYSMAL (NOW PERMANENT)ATRIAL FIBRILLATION and HYPERTENSION who presents today for ~ annual (& 1 month) f/u.[name] was last seen by me back in January 2020.  He was doing relatively well.  Stable with no sensation of irregular heartbeats.  Blood pressures are pretty stable.  Still working as a Art gallery manager.  Noticed that he was somewhat deconditioned and had a little bit of exertional dyspnea.  Just less energy than usual.  He was seen by Racquel Rodrigue-Guzman, [place] for blood pressure management in February.  Noted improved blood pressures with amlodipine 5 mg.  There was a component of whitecoat syndrome.  Advised to monitor blood pressure at home twice daily.  And will potentially increase to 7.5 to 10 mg if blood pressure meds elevated.  Recent Hospitalizations: None  Reviewed  CV studies:    The following studies were reviewed today: (if available, images/films reviewed: From Epic Chart or Care Everywhere)  None:  Interval History:   [name] returns here today stating that he is doing quite well.  He is not having any sensation whatsoever of rapid irregular heartbeats palpitations.  He brings with him a list of blood pressures that range from 116/64 mmHg with a heart rate of 62 bpm to 144/77 mmHg with a heart rate of 60 bpm.  He really has no sensation of being in A. fib.  When he does notice that he is not quite able to do the amount of exercise activity only review.  He gets tired easier than he had.  He tries to get some walking in during the day, but has has a hard time with  having any regularity with the exercise.  He does indicate that he is probably somewhat deconditioned.  No bleeding issues.  He says that he had a lot of stress related to stuff at work will try to do taxes etc.  He does indicate that after his visit with [facility], he feels much more relaxed and calm about his blood pressures.  He overall feels better and more energetic.  CV Review of Symptoms (Summary): no chest pain or dyspnea on exertion positive for - Intermittent episodes of shortness of breath, also notes decreased exercise tolerance, but nothing dramatically changed. negative for - edema, irregular heartbeat, orthopnea, palpitations, paroxysmal nocturnal dyspnea, rapid heart rate or Syncope/near syncope, TIA/amaurosis fugax, claudication  The patient does not have symptoms concerning for COVID-19 infection (fever, chills, cough, or new shortness of breath).  He did have COVID-19 in December where the major symptom he noted was chills and low-grade fevers.  Also had some diarrhea.  Had no issues with taste or smell.  Had no issues with fevers or dyspnea/hypoxia.-IV basely quarantine for about 3 weeks. --> He was told to wait about 3 months prior to getting his Covid vaccine, he is in the process of trying to schedule his Covid vaccine shots now. The patient is practicing social distancing & Masking.    REVIEWED OF SYSTEMS   Review of Systems  Constitutional: Negative for malaise/fatigue (  Some decreased exercise tolerance.  No real change from before.  Just cannot quite do what he used to do.).  HENT: Positive for nosebleeds. Negative for congestion.   Respiratory: Negative for shortness of breath (Only with some exertion.).   Gastrointestinal: Negative for blood in stool, diarrhea (Only had diarrhea when he had Covid), heartburn, melena and vomiting.  Genitourinary: Negative for hematuria.  Musculoskeletal: Negative for falls and joint pain.  Neurological: Negative for dizziness, focal  weakness, weakness and headaches.  Psychiatric/Behavioral: Negative for depression and memory loss. The patient is not nervous/anxious (Has been having a lot of stress related to his business, getting taxes done etc.) and does not have insomnia.    I have reviewed and (if needed) personally updated the patient's problem list, medications, allergies, past medical and surgical history, social and family history.   PAST MEDICAL HISTORY   Past Medical History:  Diagnosis Date   Arthritis    Dyslipidemia     on  simvastatin    Essential hypertension    Labile; often elevated in clinic visits, but never at home   Permanent atrial fibrillation ([place])    Rate controlled with beta blocker; anticoagulation with Eliquis   Ulcer of ankle ([place])    right ankle; s/p I&D   Varicose veins    s/p R GSFA Endovenous Ablation   Wears glasses    Wears hearing aid    both ears   Wears partial dentures     PAST SURGICAL HISTORY   Past Surgical History:  Procedure Laterality Date   APPLICATION OF A-CELL OF EXTREMITY Right 12/31/2013   Procedure: PLACEMENT OF ACELL RIGHT MEDIAL ANKLE;  Surgeon: Theodoro Kos, DO;  Location: [facility];  Service: Plastics;  Laterality: Right;   COLONOSCOPY     ENDOVENOUS ABLATION SAPHENOUS VEIN W/ LASER Right 01-14-2014   EVLA right greater saphenous vein by Curt Jews MD   event monitor  08/22/2011-09/20/2011   shows a fib- startedon FLECAINDE   I & D EXTREMITY Right 12/31/2013   Procedure: IRRIGATION AND DEBRIDEMENT RIGHT ANKLE WOUND;  Surgeon: Theodoro Kos, DO;  Location: [facility];  Service: Plastics;  Laterality: Right;   NASAL SINUS SURGERY     [facility] - [place]  August 2012   No infarct or ischemia.   TRANSTHORACIC ECHOCARDIOGRAM  August 2012   Normal LV size and function, normal LA size, EF> 55%.    MEDICATIONS/ALLERGIES   Current Meds  Medication Sig   amLODipine (NORVASC) 5 MG tablet Take 1  tablet (5 mg total) by mouth daily.   benzonatate (TESSALON) 100 MG capsule    ELIQUIS 5 MG TABS tablet TAKE 1 TABLET(5 MG) BY MOUTH TWICE DAILY   folic acid (FOLVITE) 546 MCG tablet Take 400 mcg by mouth daily.    metoprolol succinate (TOPROL-XL) 100 MG 24 hr tablet Take 1 tablet by mouth daily.   Omega-3 Fatty Acids (FISH OIL) 1000 MG CAPS Take by mouth.   rosuvastatin (CRESTOR) 20 MG tablet Take 20 mg by mouth daily.   Vitamin D, Cholecalciferol, 50 MCG (2000 UT) CAPS Take 1 capsule by mouth daily.   [DISCONTINUED] amLODipine (NORVASC) 5 MG tablet Take 1 tablet (5 mg total) by mouth daily.   [DISCONTINUED] flecainide (TAMBOCOR) 100 MG tablet May use as needed. 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  OBJCTIVE -PE, EKG, labs   Wt Readings from Last 3 Encounters:  03/23/19 191 lb 3.2 oz (86.7 kg)  02/17/19 194 lb 9.6 oz (88.3 kg)  01/09/18 200 lb 3.2 oz (90.8 kg)    Physical Exam: BP 134/78    Pulse (!) 53    Temp (!) 96 F (35.6 C)    Ht 5\' 11"  (1.803 m)    Wt 191 lb 3.2 oz (86.7 kg)    SpO2 99%    BMI 26.67 kg/m  Physical Exam  Constitutional: He appears well-developed and well-nourished.  HENT:  Head: Normocephalic and atraumatic.  Neck: No hepatojugular reflux and no JVD present. Carotid bruit is not present.  Cardiovascular: S1 normal, S2 normal and intact distal pulses. An irregularly irregular rhythm present. Bradycardia present. PMI is not displaced. Exam reveals distant heart sounds. Exam reveals no gallop and no friction rub.  No murmur  heard. Pulmonary/Chest: Effort normal and breath sounds normal. No respiratory distress. He has no wheezes. He has no rales.  Abdominal: Soft. Bowel sounds are normal. He exhibits no distension. There is no abdominal tenderness.  No HSM  Musculoskeletal:        General: Edema (Trace) present.     Cervical back: Normal range of motion and neck supple.  Psychiatric: He has a normal mood and affect. His behavior is normal. Judgment and thought content normal.  Vitals reviewed.    Adult ECG Report  Rate: 53;  Rhythm: atrial fibrillation and With low response.  Rightward axis-101 degree.  LVH.;   Narrative Interpretation: Relatively stable, heart rate slower.  Recent Labs: Lipids not available since 2017.  Followed by PCP-last check was reportedly in May-HDL was 33 and triglycerides 163. No results found for: CHOL, HDL, LDLCALC, LDLDIRECT, TRIG, CHOLHDL Lab Results  Component Value Date   CREATININE 1.42 (H) 07/16/2018   BUN 23 07/16/2018   NA 140 07/16/2018   K 4.5 07/16/2018   CL 105 07/16/2018   CO2 22 07/16/2018   No results found for: TSH   ASSESSMENT/PLAN    Problem List Items Addressed This Visit    Permanent atrial fibrillation (HCC) CHA2DS2Vasc Score 3, On Eliquis - Primary (Chronic)    Pretty much permanent A. fib.  He does not know if he is in or not, therefore I do not think we need to do flecainide.  We will remove flecainide from his med list.   Continue Eliquis at current dose.  Continue current dose metoprolol for rate control.  Monitor for signs of bradycardia.      Relevant Medications   amLODipine (NORVASC) 5 MG tablet   Other Relevant Orders   EKG 12-Lead   Dyslipidemia, goal LDL below 100 (Chronic)    Labs followed by PCP.  I do not have recent labs.  He is managed on current dose of rosuvastatin.  Tolerating well.      Relevant Medications   amLODipine (NORVASC) 5 MG tablet   Essential hypertension (Chronic)    Has had a history of labile blood  pressures.  Currently seem relatively stable at home.  His blood pressure readings at home correlate with the readings  here.  He did have his blood pressure cuff validated it does seem to be accurate within 10 mmHg.  He will continue to monitor his pressures, I told him that he is fine as long as his blood pressure is not consistently over 160 mmHg.  If that occurs, he can take an additional dose of amlodipine.  But I did tell him he should recheck it in 1 hour.  Only treat if it still high      Relevant Medications   amLODipine (NORVASC) 5 MG tablet       COVID-19 Education: The signs and symptoms of COVID-19 were discussed with the patient and how to seek care for testing (follow up with PCP or arrange E-visit).   The importance of social distancing was discussed today.  I spent a total of 43minutes with the patient. >  50% of the time was spent in direct patient consultation.  Additional time spent with chart review  / charting (studies, outside notes, etc): 6 Total Time: 24 min   Current medicines are reviewed at length with the patient today.  (+/- concerns) none  Notice: This dictation was prepared with Dragon dictation along with smaller phrase technology. Any transcriptional errors that result from this process are unintentional and may not be corrected upon review.  Patient Instructions / Medication Changes & Studies & Tests Ordered   Patient Instructions  Medication Instructions:  NO CHANGES *If you need a refill on your cardiac medications before your next appointment, please call your pharmacy*   Lab Work: NOT NEEDED    Testing/Procedures: NOT NEEDED   Follow-Up: At [facility], you and your health needs are our priority.  As part of our continuing mission to provide you with exceptional heart care, we have created designated Provider Care Teams.  These Care Teams include your primary Cardiologist (physician) and Advanced Practice Providers (APPs -  Physician  Assistants and Nurse Practitioners) who all work together to provide you with the care you need, when you need it.  We recommend signing up for the patient portal called "MyChart".  Sign up information is provided on this After Visit Summary.  MyChart is used to connect with patients for Virtual Visits (Telemedicine).  Patients are able to view lab/test results, encounter notes, upcoming appointments, etc.  Non-urgent messages can be sent to your provider as well.   To learn more about what you can do with MyChart, go to [email or web address].    Your next appointment:   6 month(s)  The format for your next appointment:   In Person  Provider:   You may see Glenetta Hew, MD or one of the following Advanced Practice Providers on your designated Care Team:    Rosaria Ferries, PA-C  Jory Sims, DNP, ANP  Cadence Kathlen Mody, NP     Studies Ordered:   Orders Placed This Encounter  Procedures   EKG 12-Lead     Glenetta Hew, M.D., M.S. Interventional Cardiologist   Pager # [phone] Phone # [phone] [address]. [place], [place] [address]   Thank you for choosing Heartcare at [facility]!!

## 2019-03-30 ENCOUNTER — Ambulatory Visit: Payer: Medicare Other | Attending: Internal Medicine

## 2019-03-30 DIAGNOSIS — Z23 Encounter for immunization: Secondary | ICD-10-CM

## 2019-03-30 NOTE — Progress Notes (Signed)
   Covid-19 Vaccination Clinic  Name:  Joseph Landry    MRN: [id number] DOB: 09-04-1940  03/30/2019  [name] was observed post Covid-19 immunization for 15 minutes without incident. He was provided with Vaccine Information Sheet and instruction to access the V-Safe system.   [name] was instructed to call 911 with any severe reactions post vaccine: Marland Kitchen Difficulty breathing  . Swelling of face and throat  . A fast heartbeat  . A bad rash all over body  . Dizziness and weakness   Immunizations Administered    Name Date Dose VIS Date Route   Pfizer COVID-19 Vaccine 03/30/2019 10:03 AM 0.3 mL 12/12/2018 Intramuscular   Manufacturer: [place]   Lot: [id number]   [place]: [address]-5

## 2019-04-21 ENCOUNTER — Ambulatory Visit: Payer: Medicare Other | Attending: Internal Medicine

## 2019-04-21 DIAGNOSIS — Z23 Encounter for immunization: Secondary | ICD-10-CM

## 2019-04-21 NOTE — Progress Notes (Signed)
   Covid-19 Vaccination Clinic  Name:  Joseph Landry    MRN: [id number] DOB: 1940-10-07  04/21/2019  [name] was observed post Covid-19 immunization for 15 minutes without incident. He was provided with Vaccine Information Sheet and instruction to access the V-Safe system.   [name] was instructed to call 911 with any severe reactions post vaccine: Marland Kitchen Difficulty breathing  . Swelling of face and throat  . A fast heartbeat  . A bad rash all over body  . Dizziness and weakness   Immunizations Administered    Name Date Dose VIS Date Route   Pfizer COVID-19 Vaccine 04/21/2019 12:02 PM 0.3 mL 02/25/2018 Intramuscular   Manufacturer: [place]   Lot: [id number]   [place]: [id number]

## 2019-05-06 ENCOUNTER — Other Ambulatory Visit: Payer: Self-pay

## 2019-05-06 ENCOUNTER — Emergency Department (HOSPITAL_BASED_OUTPATIENT_CLINIC_OR_DEPARTMENT_OTHER): Payer: Medicare Other

## 2019-05-06 ENCOUNTER — Encounter (HOSPITAL_BASED_OUTPATIENT_CLINIC_OR_DEPARTMENT_OTHER): Payer: Self-pay

## 2019-05-06 ENCOUNTER — Emergency Department (HOSPITAL_BASED_OUTPATIENT_CLINIC_OR_DEPARTMENT_OTHER)
Admission: EM | Admit: 2019-05-06 | Discharge: 2019-05-06 | Disposition: A | Discharge status: Home / Self Care | Payer: Medicare Other | Attending: Emergency Medicine | Admitting: Emergency Medicine

## 2019-05-06 DIAGNOSIS — R5383 Other fatigue: Secondary | ICD-10-CM | POA: Diagnosis present

## 2019-05-06 DIAGNOSIS — I10 Essential (primary) hypertension: Secondary | ICD-10-CM | POA: Diagnosis not present

## 2019-05-06 DIAGNOSIS — Z20822 Contact with and (suspected) exposure to covid-19: Secondary | ICD-10-CM | POA: Insufficient documentation

## 2019-05-06 DIAGNOSIS — J189 Pneumonia, unspecified organism: Secondary | ICD-10-CM | POA: Diagnosis not present

## 2019-05-06 DIAGNOSIS — Z79899 Other long term (current) drug therapy: Secondary | ICD-10-CM | POA: Diagnosis not present

## 2019-05-06 HISTORY — DX: Disorder of kidney and ureter, unspecified: N28.9

## 2019-05-06 LAB — URINALYSIS, ROUTINE W REFLEX MICROSCOPIC
Bilirubin Urine: NEGATIVE
Glucose, UA: NEGATIVE mg/dL
Ketones, ur: NEGATIVE mg/dL
Leukocytes,Ua: NEGATIVE
Nitrite: NEGATIVE
Protein, ur: 100 mg/dL — AB
Specific Gravity, Urine: >1.03 — ABNORMAL HIGH (ref 1.005–1.030)
pH: 6 (ref 5.0–8.0)

## 2019-05-06 LAB — CBC WITH DIFFERENTIAL/PLATELET
Abs Immature Granulocytes: 0.05 10*3/uL (ref 0.00–0.07)
Basophils Absolute: 0 10*3/uL (ref 0.0–0.1)
Basophils Relative: 0 %
Eosinophils Absolute: 0 10*3/uL (ref 0.0–0.5)
Eosinophils Relative: 0 %
HCT: 37.9 % — ABNORMAL LOW (ref 39.0–52.0)
Hemoglobin: 13.4 g/dL (ref 13.0–17.0)
Immature Granulocytes: 0 %
Lymphocytes Relative: 14 %
Lymphs Abs: 1.7 10*3/uL (ref 0.7–4.0)
MCH: 33.3 pg (ref 26.0–34.0)
MCHC: 35.4 g/dL (ref 30.0–36.0)
MCV: 94.3 fL (ref 80.0–100.0)
Monocytes Absolute: 2.2 10*3/uL — ABNORMAL HIGH (ref 0.1–1.0)
Monocytes Relative: 18 %
Neutro Abs: 8 10*3/uL — ABNORMAL HIGH (ref 1.7–7.7)
Neutrophils Relative %: 68 %
Platelets: 133 10*3/uL — ABNORMAL LOW (ref 150–400)
RBC: 4.02 MIL/uL — ABNORMAL LOW (ref 4.22–5.81)
RDW: 12.8 % (ref 11.5–15.5)
Smear Review: NORMAL
WBC: 12 10*3/uL — ABNORMAL HIGH (ref 4.0–10.5)
nRBC: 0 % (ref 0.0–0.2)

## 2019-05-06 LAB — COMPREHENSIVE METABOLIC PANEL
ALT: 23 U/L (ref 0–44)
AST: 29 U/L (ref 15–41)
Albumin: 3.7 g/dL (ref 3.5–5.0)
Alkaline Phosphatase: 51 U/L (ref 38–126)
Anion gap: 9 (ref 5–15)
BUN: 26 mg/dL — ABNORMAL HIGH (ref 8–23)
CO2: 22 mmol/L (ref 22–32)
Calcium: 8.9 mg/dL (ref 8.9–10.3)
Chloride: 103 mmol/L (ref 98–111)
Creatinine, Ser: 1.64 mg/dL — ABNORMAL HIGH (ref 0.61–1.24)
GFR calc Af Amer: 45 mL/min — ABNORMAL LOW (ref >60)
GFR calc non Af Amer: 39 mL/min — ABNORMAL LOW (ref >60)
Glucose, Bld: 129 mg/dL — ABNORMAL HIGH (ref 70–99)
Potassium: 3.8 mmol/L (ref 3.5–5.1)
Sodium: 134 mmol/L — ABNORMAL LOW (ref 135–145)
Total Bilirubin: 1.3 mg/dL — ABNORMAL HIGH (ref 0.3–1.2)
Total Protein: 7.4 g/dL (ref 6.5–8.1)

## 2019-05-06 LAB — URINALYSIS, MICROSCOPIC (REFLEX): WBC, UA: NONE SEEN WBC/hpf (ref 0–5)

## 2019-05-06 LAB — LACTIC ACID, PLASMA: Lactic Acid, Venous: 1.2 mmol/L (ref 0.5–1.9)

## 2019-05-06 MED ORDER — IOHEXOL 300 MG/ML  SOLN
100.0000 mL | Freq: Once | INTRAMUSCULAR | Status: AC | PRN
  Administered 2019-05-06: 75 mL via INTRAVENOUS

## 2019-05-06 MED ORDER — SODIUM CHLORIDE 0.9 % IV SOLN
500.0000 mg | Freq: Once | INTRAVENOUS | Status: AC
  Administered 2019-05-06: 500 mg via INTRAVENOUS
  Filled 2019-05-06: qty 500

## 2019-05-06 MED ORDER — SODIUM CHLORIDE 0.9 % IV SOLN: INTRAVENOUS | Status: DC

## 2019-05-06 MED ORDER — AMOXICILLIN 500 MG PO CAPS
500.0000 mg | ORAL_CAPSULE | Freq: Three times a day (TID) | ORAL | 0 refills | Status: DC
Start: 2019-05-06 — End: 2019-11-13

## 2019-05-06 MED ORDER — SODIUM CHLORIDE 0.9 % IV SOLN
1.0000 g | Freq: Once | INTRAVENOUS | Status: AC
  Administered 2019-05-06: 21:00:00 1 g via INTRAVENOUS
  Filled 2019-05-06: qty 10

## 2019-05-06 MED ORDER — AZITHROMYCIN 250 MG PO TABS
250.0000 mg | ORAL_TABLET | Freq: Every day | ORAL | 0 refills | Status: DC
Start: 2019-05-06 — End: 2019-11-13

## 2019-05-06 NOTE — ED Provider Notes (Signed)
[facility] EMERGENCY DEPARTMENT Provider Note   CSN: [id number] Arrival date & time: 05/06/19  1851     History Chief Complaint  Patient presents with  . Fatigue    Joseph Landry is a 79 y.o. male.  HPI   79 year old male with generalized weakness.  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  For the past several days he has had no energy.  He feels very fatigued.  Is not quite sure why.  No acute pain.  No fevers.  Occasional nonproductive cough.  No urinary complaints.  He has had Covid and is also been socially vaccinated as well.  No vomiting or diarrhea.  Is anticoagulated for history of atrial fibrillation.  Denies any bright red blood per rectum or melena.  Past Medical History:  Diagnosis Date  . Arthritis   . Dyslipidemia     on  simvastatin   . Essential hypertension    Labile; often elevated in clinic visits, but never at home  . Kidney disease   . Permanent atrial fibrillation (HCC)    Rate controlled with beta blocker; anticoagulation with Eliquis  . Ulcer of ankle (HCC)    right ankle; s/p I&D  . Varicose veins    s/p R GSFA Endovenous Ablation  . Wears glasses   . Wears hearing aid    both ears  . Wears partial dentures     Patient Active Problem List   Diagnosis Date Noted  . 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 H/O hematuria 08/18/2013  . H/O: GI bleed 08/18/2013  . Permanent atrial fibrillation (HCC) CHA2DS2Vasc Score 3, On Eliquis   . Dyslipidemia, goal LDL below 100   . Essential hypertension     Past Surgical History:  Procedure Laterality Date  . APPLICATION OF A-CELL OF EXTREMITY Right 12/31/2013   Procedure: PLACEMENT OF ACELL RIGHT MEDIAL ANKLE;  Surgeon: Theodoro Kos, DO;  Location: [facility];  Service: Plastics;  Laterality: Right;  . COLONOSCOPY    . ENDOVENOUS ABLATION SAPHENOUS VEIN W/ LASER  Right 01-14-2014   EVLA right greater saphenous vein by Curt Jews MD  . event monitor  08/22/2011-09/20/2011   shows a fib- startedon FLECAINDE  . I & D EXTREMITY Right 12/31/2013   Procedure: IRRIGATION AND DEBRIDEMENT RIGHT ANKLE WOUND;  Surgeon: Theodoro Kos, DO;  Location: [facility];  Service: Plastics;  Laterality: Right;  . NASAL SINUS SURGERY    . NM MYOVIEW LTD - Persantine  August 2012   No infarct or ischemia.  Domingo Dimes ECHOCARDIOGRAM  August 2012   Normal LV size and function, normal LA size, EF> 55%.       Family History  Problem Relation Age of Onset  . Stroke Mother 54  . Heart disease Mother     Social History   Tobacco Use  . Smoking status: Never Smoker  . Smokeless tobacco: Never Used  Substance Use Topics  . Alcohol use: Yes    Alcohol/week: 0.0 standard drinks    Comment: occ  . Drug use: No    Home Medications Prior to Admission medications   Medication Sig Start Date End Date Taking? Authorizing Provider  amLODipine (NORVASC) 5 MG tablet Take 1 tablet (5 mg total) by mouth daily. 03/23/19 06/21/19  Leonie Man, MD  benzonatate (TESSALON) 100 MG  capsule  12/15/18   [provider]  ELIQUIS 5 MG TABS tablet TAKE 1 TABLET(5 MG) BY MOUTH TWICE DAILY 01/27/19   Leonie Man, MD  folic acid (FOLVITE) 888 MCG tablet Take 400 mcg by mouth daily.     [provider]  metoprolol succinate (TOPROL-XL) 100 MG 24 hr tablet Take 1 tablet by mouth daily. 11/02/13   [provider]  Omega-3 Fatty Acids (FISH OIL) 1000 MG CAPS Take by mouth.    [provider]  rosuvastatin (CRESTOR) 20 MG tablet Take 20 mg by mouth daily. 03/02/16   [provider]  Vitamin D, Cholecalciferol, 50 MCG (2000 UT) CAPS Take 1 capsule by mouth daily.    [provider]    Allergies    Multaq [dronedarone]  Review of Systems   Review of Systems All systems reviewed and negative, other than as noted in  HPI.  Physical Exam Updated Vital Signs BP (!) 147/72 (BP Location: Left Arm)   Pulse 80   Temp 99.4 F (37.4 C) (Oral)   Resp 16   Ht 5\' 11"  (1.803 m)   Wt 89.6 kg   SpO2 95%   BMI 27.56 kg/m   Physical Exam Vitals and nursing note reviewed.  Constitutional:      General: He is not in acute distress.    Appearance: He is well-developed.  HENT:     Head: Normocephalic and atraumatic.  Eyes:     General:        Right eye: No discharge.        Left eye: No discharge.     Conjunctiva/sclera: Conjunctivae normal.  Cardiovascular:     Rate and Rhythm: Normal rate and regular rhythm.     Heart sounds: Normal heart sounds. No murmur. No friction rub. No gallop.   Pulmonary:     Effort: Pulmonary effort is normal. No respiratory distress.     Breath sounds: Normal breath sounds.  Abdominal:     General: There is no distension.     Palpations: Abdomen is soft.     Tenderness: There is no abdominal tenderness.  Musculoskeletal:        General: No tenderness.     Cervical back: Neck supple.  Skin:    General: Skin is warm and dry.  Neurological:     Mental Status: He is alert.  Psychiatric:        Behavior: Behavior normal.        Thought Content: Thought content normal.     ED Results / Procedures / Treatments   Labs (all labs ordered are listed, but only abnormal results are displayed) Labs Reviewed  URINE CULTURE - Abnormal; Notable for the following components:      Result Value   Culture 20,000 COLONIES/mL ESCHERICHIA COLI (*)    Organism ID, Bacteria ESCHERICHIA COLI (*)    All other components within normal limits  CBC WITH DIFFERENTIAL/PLATELET - Abnormal; Notable for the following components:   WBC 12.0 (*)    RBC 4.02 (*)    HCT 37.9 (*)    Platelets 133 (*)    Neutro Abs 8.0 (*)    Monocytes Absolute 2.2 (*)    All other components within normal limits  COMPREHENSIVE METABOLIC PANEL - Abnormal; Notable for the following components:   Sodium 134 (*)     Glucose, Bld 129 (*)    BUN 26 (*)    Creatinine, Ser 1.64 (*)    Total Bilirubin  1.3 (*)    GFR calc non Af Amer 39 (*)    GFR calc Af Amer 45 (*)    All other components within normal limits  URINALYSIS, ROUTINE W REFLEX MICROSCOPIC - Abnormal; Notable for the following components:   Specific Gravity, Urine >1.030 (*)    Hgb urine dipstick MODERATE (*)    Protein, ur 100 (*)    All other components within normal limits  URINALYSIS, MICROSCOPIC (REFLEX) - Abnormal; Notable for the following components:   Bacteria, UA RARE (*)    All other components within normal limits  SARS CORONAVIRUS 2 (TAT 6-24 HRS)  CULTURE, BLOOD (ROUTINE X 2)  CULTURE, BLOOD (ROUTINE X 2)  LACTIC ACID, PLASMA    EKG EKG Interpretation  Date/Time:  Wednesday May 06 2019 20:03:11 EDT Ventricular Rate:  67 PR Interval:    QRS Duration: 89 QT Interval:  388 QTC Calculation: 410 R Axis:   88 Text Interpretation: Atrial fibrillation Borderline right axis deviation Minimal ST depression, diffuse leads No old tracing to compare Confirmed by Noemi Chapel [phone]) on 05/07/2019 5:16:37 PM   Radiology No results found.   CT ABDOMEN PELVIS W CONTRAST  Result Date: 05/06/2019 CLINICAL DATA:  Right lower quadrant pain, somnolent EXAM: CT ABDOMEN AND PELVIS WITH CONTRAST TECHNIQUE: Multidetector CT imaging of the abdomen and pelvis was performed using the standard protocol following bolus administration of intravenous contrast. CONTRAST:  58mL OMNIPAQUE IOHEXOL 300 MG/ML  SOLN COMPARISON:  07/13/2013 FINDINGS: Lower chest: No acute pleural or parenchymal lung disease. Hepatobiliary: No focal liver abnormality is seen. No gallstones, gallbladder wall thickening, or biliary dilatation. Pancreas: Unremarkable. No pancreatic ductal dilatation or surrounding inflammatory changes. Spleen: Normal in size without focal abnormality. Adrenals/Urinary Tract: There are 2 stable nonobstructing right renal calculi largest  measuring 4 mm. No right-sided obstructive uropathy. The left kidney is unremarkable. The adrenals are normal. The bladder is minimally distended with nonspecific wall thickening. Stomach/Bowel: No bowel obstruction or ileus. Right inguinal hernia is identified, now containing multiple loops of distal small bowel. No evidence of incarceration or strangulation. No bowel wall thickening or inflammatory change. Normal appendix right lower quadrant. Vascular/Lymphatic: Aortic atherosclerosis. No enlarged abdominal or pelvic lymph nodes. Reproductive: Prostate is enlarged but stable. Other: No free fluid or free gas. Right inguinal hernia as above. Musculoskeletal: No acute or destructive bony lesions. Reconstructed images demonstrate no additional findings. IMPRESSION: 1. Right inguinal hernia now containing multiple loops of distal small bowel. No evidence of incarceration or strangulation. 2. Stable nonobstructing right renal calculi. 3. Aortic Atherosclerosis (ICD10-I70.0). Electronically Signed   By: Randa Ngo M.D.   On: 05/06/2019 21:10   DG Chest Portable 1 View  Result Date: 05/06/2019 CLINICAL DATA:  [age]-year-old male with fatigue and congestion. EXAM: PORTABLE CHEST 1 VIEW COMPARISON:  None. FINDINGS: There is an area of increased airspace opacity in the left upper lung field concerning for infiltrate. Clinical correlation and follow-up to resolution recommended. No pleural effusion or pneumothorax. Top-normal cardiac size. Atherosclerotic calcification of the aorta. No acute osseous pathology. IMPRESSION: Left upper lung field infiltrate. Clinical correlation and follow-up to resolution recommended. Electronically Signed   By: Anner Crete M.D.   On: 05/06/2019 20:10    Procedures Procedures (including critical care time)  Medications Ordered in ED Medications - No data to display  ED Course  I have reviewed the triage vital signs and the nursing notes.  Pertinent labs & imaging  results that were available during my care  of the patient were reviewed by me and considered in my medical decision making (see chart for details).    MDM Rules/Calculators/A&P                     79 year old male with generalized weakness.  Really no focality to symptoms otherwise.  Possibly pneumonia on x-ray.  Mild leukocytosis.  Does endorse little bit of a cough.  Is in no acute distress.  O2 sats are fine on room air.  Placed on antibiotics for community-acquired pneumonia.  Of note, urine culture subsequently coming back positive for E. coli.  Previously ordered antibiotic should appropriately cover.    Final Clinical Impression(s) / ED Diagnoses Final diagnoses:  Community acquired pneumonia of left upper lobe of lung    Rx / DC Orders ED Discharge Orders    None       Virgel Manifold, MD 05/11/19 1200

## 2019-05-06 NOTE — ED Triage Notes (Signed)
Pt c/o no energy, sleeping a lot x 2 days-denies fever/flu sx-states he had RLQ pain that feels same as when he had a hernia-NAD-steady gait

## 2019-05-06 NOTE — ED Notes (Signed)
ED Provider at bedside. 

## 2019-05-07 LAB — SARS CORONAVIRUS 2 (TAT 6-24 HRS): SARS Coronavirus 2: NEGATIVE

## 2019-05-08 LAB — URINE CULTURE: Culture: 20000 — AB

## 2019-05-09 ENCOUNTER — Telehealth: Payer: Self-pay | Admitting: Emergency Medicine

## 2019-05-09 NOTE — Telephone Encounter (Signed)
Post ED Visit - Positive Culture Follow-up  Culture report reviewed by antimicrobial stewardship pharmacist: [place] Team []  Elenor Quinones, Pharm.D. []  Heide Guile, Pharm.D., BCPS AQ-ID []  Parks Neptune, Pharm.D., BCPS []  Alycia Rossetti, Pharm.D., BCPS []  [place], Pharm.D., BCPS, AAHIVP []  Legrand Como, Pharm.D., BCPS, AAHIVP []  Salome Arnt, PharmD, BCPS []  Johnnette Gourd, PharmD, BCPS []  Hughes Better, PharmD, BCPS [x]  Duanne Limerick, PharmD []  Laqueta Linden, PharmD, BCPS []  Albertina Parr, PharmD  [place] Team []  Leodis Sias, PharmD []  Lindell Spar, PharmD []  Royetta Asal, PharmD []  Graylin Shiver, Rph []  Rema Fendt) Glennon Mac, PharmD []  Arlyn Dunning, PharmD []  Netta Cedars, PharmD []  Dia Sitter, PharmD []  Leone Haven, PharmD []  Gretta Arab, PharmD []  Theodis Shove, PharmD []  Peggyann Juba, PharmD []  Reuel Boom, PharmD   Positive urine culture Treated with Amoxicillin, organism sensitive to the same and no further patient follow-up is required at this time.  Milus Mallick 05/09/2019, 6:23 PM

## 2019-05-11 LAB — CULTURE, BLOOD (ROUTINE X 2)
Culture: NO GROWTH
Culture: NO GROWTH
Special Requests: ADEQUATE

## 2019-05-15 DIAGNOSIS — I872 Venous insufficiency (chronic) (peripheral): Secondary | ICD-10-CM | POA: Insufficient documentation

## 2019-06-08 ENCOUNTER — Encounter: Payer: Self-pay | Admitting: Cardiology

## 2019-06-09 ENCOUNTER — Telehealth: Payer: Self-pay | Admitting: Cardiology

## 2019-06-09 NOTE — Telephone Encounter (Signed)
I attempted to contact patient 06/09/19 to schedule follow up visit from patient's recall list. The patient didn't answer so I left message to return call in order to get that scheduled.

## 2019-07-23 ENCOUNTER — Other Ambulatory Visit: Payer: Self-pay | Admitting: Cardiology

## 2019-07-23 NOTE — Telephone Encounter (Signed)
Rx has been sent to the pharmacy electronically. ° °

## 2019-09-24 ENCOUNTER — Ambulatory Visit: Payer: Medicare Other | Admitting: Cardiology

## 2019-11-13 ENCOUNTER — Ambulatory Visit (INDEPENDENT_AMBULATORY_CARE_PROVIDER_SITE_OTHER): Payer: Medicare Other | Admitting: Cardiology

## 2019-11-13 ENCOUNTER — Other Ambulatory Visit: Payer: Self-pay

## 2019-11-13 ENCOUNTER — Encounter: Payer: Self-pay | Admitting: Cardiology

## 2019-11-13 VITALS — BP 122/84 | HR 56 | Ht 70.0 in | Wt 201.6 lb

## 2019-11-13 DIAGNOSIS — N183 Chronic kidney disease, stage 3 unspecified: Secondary | ICD-10-CM | POA: Insufficient documentation

## 2019-11-13 DIAGNOSIS — I10 Essential (primary) hypertension: Secondary | ICD-10-CM

## 2019-11-13 DIAGNOSIS — I83023 Varicose veins of left lower extremity with ulcer of ankle: Secondary | ICD-10-CM

## 2019-11-13 DIAGNOSIS — I4821 Permanent atrial fibrillation: Secondary | ICD-10-CM

## 2019-11-13 DIAGNOSIS — E785 Hyperlipidemia, unspecified: Secondary | ICD-10-CM

## 2019-11-13 DIAGNOSIS — N184 Chronic kidney disease, stage 4 (severe): Secondary | ICD-10-CM

## 2019-11-13 DIAGNOSIS — L97321 Non-pressure chronic ulcer of left ankle limited to breakdown of skin: Secondary | ICD-10-CM

## 2019-11-13 NOTE — Progress Notes (Signed)
Primary Care Provider: Deland Pretty, MD Cardiologist: Glenetta Hew, MD Electrophysiologist: None  Nephrology: Dr. Carolin Sicks  Clinic Note: Chief Complaint  Patient presents with  . Follow-up    Delayed 26-monthopen was delayed because of his pneumonia resolved  . Atrial Fibrillation    stable - no Sx of rapid HR    HPI:    Joseph BRUCATOis a 79y.o. male with a PMH notable for LONGSTANDING PERMANENT ATRIAL FIBRILLATION, and hypertension who presents today for delayed 645-monthollow-up.   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  No sensation of rapid irregular heartbeats palpitations.  Blood pressures were in a range between 116/64 up to 144/77 with heart rates in the 60s.  No sensation of being in A. fib.  Not able to exercise as much, tiring more easily.  No bleeding issues.  Stopped Flecanide Had recovered well from COVID  Recent Hospitalizations:   05/06/2019-ER visit for left lower lobe pneumonia - CLEARED UP quickly with Abx  [facility] seen for fatigue and cough.  PCP indicated he may be having a viral URI.  Recommended Mucinex.  Partial relief. of cough -> thought to be viral  Reviewed  CV studies:    The following studies were reviewed today: (if available, images/films reviewed: From Epic Chart or Care Everywhere) . None:   Interval History:   JaDonnetta Hutchingressing daily overall doing well.  He is now back to his baseline after having met pneumonia in May millimeters URI in June.  He has had both cataracts done in October.  Besides  having a flareup of gout recently, he has not really had any issues.  He is doing yard work, but only knows a half of the already each day.  He is not fully back to walking after his pneumonia.  He still denies any symptoms of being in A. fib.  No sense of fast or irregular heartbeats.  He says he gets short of breath if he exerts quite a bit.  May be prolonged walking, rushing upstairs or carrying something movement make him short of breath.  He also has ended edema if he is on his feet all day.  Usually goes down at night.  He is retired now.  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  CV Review of Symptoms (Summary): positive for - End of day edema that goes down with foot elevation, dyspnea with increased exertion and may be some mild reduced exercise tolerance. negative for - chest pain, irregular heartbeat, orthopnea, palpitations, paroxysmal nocturnal dyspnea, rapid heart rate, shortness of breath or Syncope/near syncope, TIA/amaurosis fugax  The patient does not have symptoms concerning for COVID-19 infection (fever, chills, cough, or new shortness of breath).   REVIEWED OF SYSTEMS   Review of Systems  Constitutional: Negative for malaise/fatigue (Just a little bit of exercise intolerance.  Not able to do what he used to be on do.) and weight loss.  HENT: Negative for congestion and nosebleeds.  Respiratory: Negative for shortness of breath.   Gastrointestinal: Negative for blood in stool and melena.  Genitourinary: Negative for hematuria.  Musculoskeletal: Positive for joint pain.  Neurological: Negative for dizziness, focal weakness and headaches.  Psychiatric/Behavioral: Negative for memory loss. The patient is not nervous/anxious and does not have insomnia.        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     I have reviewed and (if needed) personally updated the patient's problem list,  medications, allergies, past medical and surgical history, social and family history.   PAST MEDICAL HISTORY   Past Medical History:  Diagnosis Date  . Arthritis   . Dyslipidemia     on  simvastatin   . Essential hypertension    Labile; often elevated in clinic visits, but never at home  . Kidney disease   . Permanent atrial fibrillation (HCC)    Rate controlled with beta blocker; anticoagulation with Eliquis  . Ulcer of ankle (HCC)    right ankle; s/p I&D  . Varicose veins    s/p R GSFA Endovenous Ablation  . Wears glasses   . Wears hearing aid    both ears  . Wears partial dentures     PAST SURGICAL HISTORY   Past Surgical History:  Procedure Laterality Date  . APPLICATION OF A-CELL OF EXTREMITY Right 12/31/2013   Procedure: PLACEMENT OF ACELL RIGHT MEDIAL ANKLE;  Surgeon: Theodoro Kos, DO;  Location: [facility];  Service: Plastics;  Laterality: Right;  . COLONOSCOPY    . ENDOVENOUS ABLATION SAPHENOUS VEIN W/ LASER Right 01-14-2014   EVLA right greater saphenous vein by Curt Jews MD  . event monitor  08/22/2011-09/20/2011   shows a fib- startedon FLECAINDE  . I & D EXTREMITY Right 12/31/2013   Procedure: IRRIGATION AND DEBRIDEMENT RIGHT ANKLE WOUND;  Surgeon: Theodoro Kos, DO;  Location: [facility];  Service: Plastics;  Laterality: Right;  . NASAL SINUS SURGERY    . NM MYOVIEW LTD - Persantine  August 2012   No infarct or ischemia.  Domingo Dimes ECHOCARDIOGRAM  August 2012   Normal LV size and function, normal LA size, EF> 55%.    Immunization History  Administered Date(s) Administered  . PFIZER SARS-COV-2 Vaccination 03/30/2019, 04/21/2019    MEDICATIONS/ALLERGIES   Current Meds  Medication Sig  . amLODipine (NORVASC) 5 MG tablet Take 1 tablet (5 mg total) by mouth daily.  Marland Kitchen ELIQUIS 5 MG TABS tablet TAKE 1 TABLET(5 MG) BY MOUTH TWICE DAILY  . folic acid (FOLVITE) 191 MCG tablet Take 400 mcg by mouth daily.   . metoprolol  succinate (TOPROL-XL) 100 MG 24 hr tablet Take 1 tablet by mouth daily.  . Omega-3 Fatty Acids (FISH OIL) 1000 MG CAPS Take by mouth.  . rosuvastatin (CRESTOR) 20 MG tablet Take 20 mg by mouth daily.  . Vitamin D, Cholecalciferol, 50 MCG (2000 UT) CAPS Take 1 capsule by mouth daily.    Allergies  Allergen Reactions  . 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  He now is taking advantage of the time he has a spend time with his grandkids.  He has been back and [place]  [place]  He also enjoys doing his yard work but can only do about a half a day of it.  The one thing he regrets is that he is not really been walking as much as he used to.  He has put on about 7 or 8 pounds since retiring because he is "eating out more ".  OBJCTIVE -PE, EKG, labs   Wt Readings from Last 3 Encounters:  11/13/19 201 lb 9.6 oz (91.4 kg)  05/06/19 197 lb 9.6 oz (89.6 kg)  03/23/19 191 lb 3.2 oz (86.7 kg)    Physical Exam: BP 122/84   Pulse (!) 56   Ht _0  (1.778 m)   Wt 201 lb 9.6 oz (91.4 kg)   BMI 28.93 kg/m  Physical Exam Constitutional:      General: He is not in acute distress.    Appearance: Normal appearance. He is normal weight. He is not ill-appearing or toxic-appearing.  HENT:     Head: Normocephalic and atraumatic.  Neck:     Vascular: No carotid bruit, hepatojugular reflux or JVD.  Cardiovascular:     Rate and Rhythm: Bradycardia present. Rhythm irregularly irregular.  No extrasystoles are present.    Chest Wall: PMI is not displaced.     Pulses: Normal pulses.     Heart sounds: Normal heart sounds. No murmur heard.  No friction rub. No gallop.   Pulmonary:     Effort: Pulmonary effort is normal. No respiratory distress.     Breath sounds: Normal breath sounds.   Chest:     Chest wall: No tenderness.  Musculoskeletal:        General: No swelling. Normal range of motion.     Cervical back: Normal range of motion and neck supple.  Neurological:     General: No focal deficit present.     Mental Status: He is alert and oriented to person, place, and time. Mental status is at baseline.  Psychiatric:        Mood and Affect: Mood normal.        Behavior: Behavior normal.        Thought Content: Thought content normal.        Judgment: Judgment normal.       Adult ECG Report  Rate: 56 ;  Rhythm: atrial fibrillation and Slow rate.  Mild nonspecific ST and T wave changes.;   Narrative Interpretation: Stable EKG.  Recent Labs: 06/03/2019: TC 146, TG 101, HDL 35, LDL 60. No results found for: CHOL, HDL, LDLCALC, LDLDIRECT, TRIG, CHOLHDL Lab Results  Component Value Date   CREATININE 1.64 (H) 05/06/2019   BUN 26 (H) 05/06/2019   NA 134 (L) 05/06/2019   K 3.8 05/06/2019   CL 103 05/06/2019   CO2 22 05/06/2019   No results found for: TSH  ASSESSMENT/PLAN    Problem List Items Addressed This Visit    Permanent atrial fibrillation (HCC) CHA2DS2Vasc Score 3, On Eliquis - Primary (Chronic)    Permanent A. fib.  No longer using any antiarrhythmics.  Simply rate control with Toprol.  On Eliquis.  No real bleeding issues.      Relevant Orders   EKG 12-Lead (Completed)   Dyslipidemia, goal LDL below 100 (Chronic)    Converted to rosuvastatin.  I reviewed labs in June-LDL is 60.  Well-controlled.  No change.      Essential hypertension (Chronic)    Stable blood pressures.  Continue amlodipine and Toprol.      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  We talked about doing dorsiflexion plantarflexion exercises, walking, foot elevation and when possible to wear compression stockings.  No further ulcers.      CKD (chronic kidney  disease), stage IV ([place])    Now being followed by nephrology.  Is due to have recheck soon.        COVID-19 Education: The signs and symptoms of COVID-19 were discussed with the patient and how to seek care for testing (follow up with PCP or arrange E-visit).   The importance of social distancing and COVID-19 vaccination was discussed today. 2 min.  He had Covid in December 2020. The patient is practicing social distancing & Masking.   I spent a total of 26 minutes with the patient spent in direct patient consultation.  Additional time spent with chart review  / charting (studies, outside notes, etc): 8 Total Time: 34 min   Current medicines are reviewed at length with the patient today.  (+/- concerns) n/a  This visit occurred during the SARS-CoV-2 public health emergency.  Safety protocols were in place, including screening questions prior to the visit, additional usage of staff PPE, and extensive cleaning of exam room while observing appropriate contact time as indicated for disinfecting solutions.  Notice: This dictation was prepared with Dragon dictation along with smaller phrase technology. Any transcriptional errors that result from this process are unintentional and may not be corrected upon review.  Patient Instructions / Medication Changes & Studies & Tests Ordered   Patient Instructions  Medication Instructions:  NO CHANGES  y*   Lab Work: NOT NEEDED    Testing/Procedures:  NOT NEEDED  Follow-Up: At [facility], you and your health needs are our priority.  As part of our continuing mission to provide you with exceptional heart care, we have created designated Provider Care Teams.  These Care Teams include your primary Cardiologist (physician) and Advanced Practice Providers (APPs -  Physician Assistants and Nurse Practitioners) who all work together to provide you with the care you need, when you need it.     Your next appointment:   12 month(s)  The  format for your next appointment:   In Person  Provider:   Glenetta Hew, MD   Other Instructions   RESTART WEARING SUPPORT SOCKS  MONITOR IF YOU HAVE  ANY EXERCISE FATIGUE - FEELING TIRED OR WORN OUT - AND NOTIFY OFFICE    Studies Ordered:   Orders Placed This Encounter  Procedures  . EKG 12-Lead     Glenetta Hew, M.D., M.S. Interventional Cardiologist   Pager # [phone] Phone # [phone] [address] [place], [place] [address]   Thank you for choosing [facility] at [facility]!!

## 2019-11-13 NOTE — Patient Instructions (Signed)
Medication Instructions:  NO CHANGES  y*   Lab Work: NOT NEEDED    Testing/Procedures:  NOT NEEDED  Follow-Up: At [facility], you and your health needs are our priority.  As part of our continuing mission to provide you with exceptional heart care, we have created designated Provider Care Teams.  These Care Teams include your primary Cardiologist (physician) and Advanced Practice Providers (APPs -  Physician Assistants and Nurse Practitioners) who all work together to provide you with the care you need, when you need it.     Your next appointment:   12 month(s)  The format for your next appointment:   In Person  Provider:   Glenetta Hew, MD   Other Instructions   RESTART WEARING SUPPORT SOCKS  MONITOR IF YOU HAVE  ANY EXERCISE FATIGUE - FEELING TIRED OR WORN OUT - AND NOTIFY OFFICE

## 2019-11-23 ENCOUNTER — Encounter: Payer: Self-pay | Admitting: Cardiology

## 2019-11-23 NOTE — Assessment & Plan Note (Signed)
Now being followed by nephrology.  Is due to have recheck soon.

## 2019-11-23 NOTE — Assessment & Plan Note (Signed)
Continue to monitor routinely.  We talked about doing dorsiflexion plantarflexion exercises, walking, foot elevation and when possible to wear compression stockings.  No further ulcers.

## 2019-11-23 NOTE — Assessment & Plan Note (Signed)
Permanent A. fib.  No longer using any antiarrhythmics.  Simply rate control with Toprol.  On Eliquis.  No real bleeding issues.

## 2019-11-23 NOTE — Assessment & Plan Note (Signed)
Stable blood pressures.  Continue amlodipine and Toprol.

## 2019-11-23 NOTE — Assessment & Plan Note (Addendum)
Converted to rosuvastatin.  I reviewed labs in June-LDL is 60.  Well-controlled.  No change.

## 2020-01-08 ENCOUNTER — Ambulatory Visit: Payer: Medicare Other | Attending: Internal Medicine

## 2020-01-08 DIAGNOSIS — Z23 Encounter for immunization: Secondary | ICD-10-CM

## 2020-01-08 NOTE — Progress Notes (Signed)
   Covid-19 Vaccination Clinic  Name:  Joseph Landry    MRN: [id number] DOB: Jun 12, 1940  01/08/2020  Joseph Landry was observed post Covid-19 immunization for 15 minutes without incident. He was provided with Vaccine Information Sheet and instruction to access the V-Safe system.   Joseph Landry was instructed to call 911 with any severe reactions post vaccine: Marland Kitchen Difficulty breathing  . Swelling of face and throat  . A fast heartbeat  . A bad rash all over body  . Dizziness and weakness   Immunizations Administered    Name Date Dose VIS Date Route   Pfizer COVID-19 Vaccine 01/08/2020  1:59 PM 0.3 mL 10/21/2019 Intramuscular   Manufacturer: [place]   Lot: [id number]   NDC: [address]-6

## 2020-04-08 ENCOUNTER — Other Ambulatory Visit: Payer: Self-pay | Admitting: Cardiology

## 2020-05-19 ENCOUNTER — Other Ambulatory Visit: Payer: Self-pay | Admitting: Cardiology

## 2020-05-19 NOTE — Telephone Encounter (Signed)
Pt requests refill of eliquis '5mg'$  when they only qualify for 2.'5mg'$  will route to pharmd pool. 74m 91.4kg, scr 1.8 06/03/19, lovw/harding 11/13/19

## 2020-05-19 NOTE — Telephone Encounter (Signed)
Called and lm for pt to decrease to 2.'5mg'$  eliquis per request of megan supple rph advised them to call back if they have questions

## 2020-05-19 NOTE — Telephone Encounter (Signed)
05/06/19 SCr 1.64, 11/17/19 SCr 1.82 Agree pt qualifies for dose reduction of Eliquis, refill was sent in incorrectly by untrained staff member last July 4 days after pt's 79th birthday and should have qualified for dose reduction at that time.   Please send in refill for Eliquis 2.'5mg'$  BID and advise pt of dose reduction. He can cut current '5mg'$  tablets in half and take 1/2 tablet twice daily until he runs out of current supply before changing to new strength.

## 2020-08-05 IMAGING — US US RENAL
1 series · 14 of 25 positions shown · non-contrast
Comparison: 07/13/2013

CLINICAL DATA: Chronic kidney disease

EXAM:
RENAL / URINARY TRACT ULTRASOUND COMPLETE

[Series 1: us renal · 0.23mm/px · 14 of 38 slices shown]
[im 1/38]
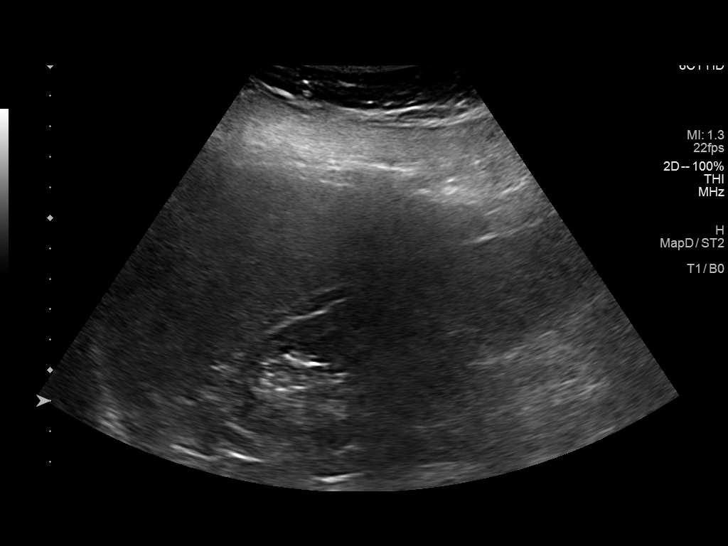
[im 4/38]
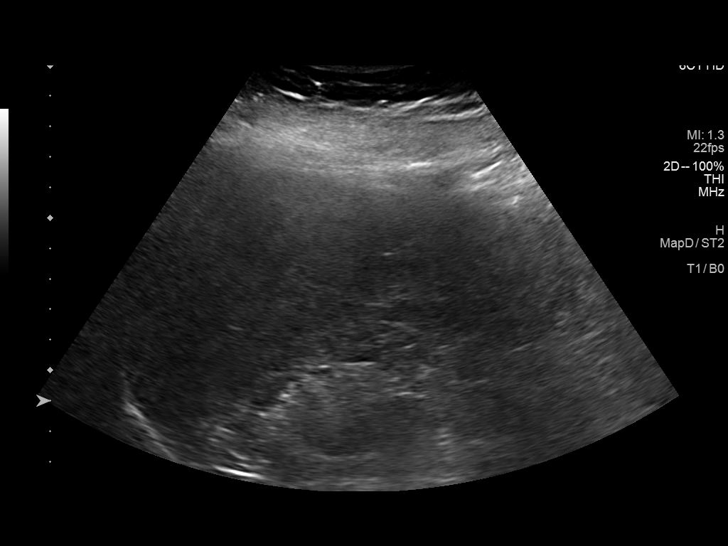
[im 7/38]
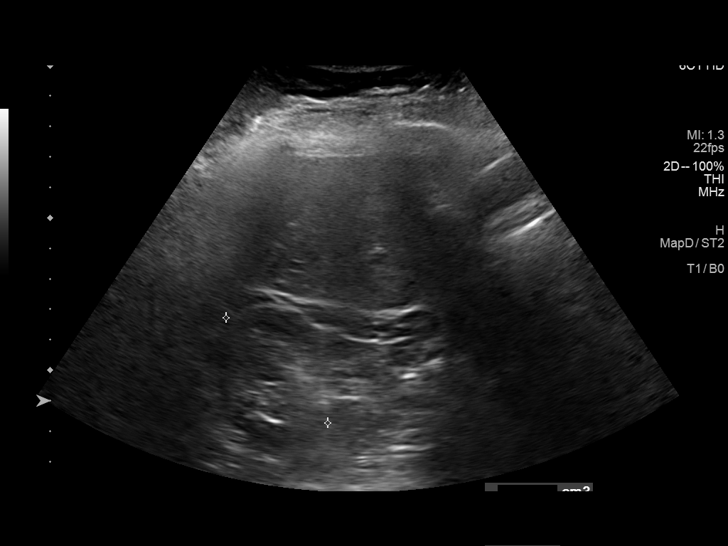
[im 10/38]
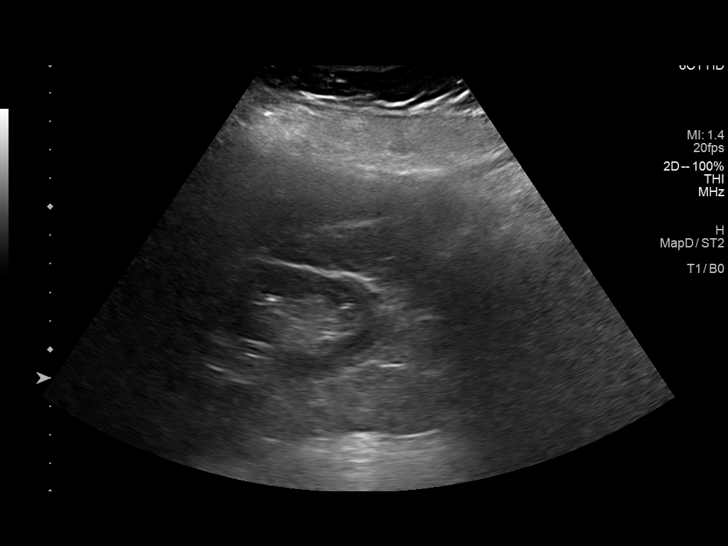
[im 13/38]
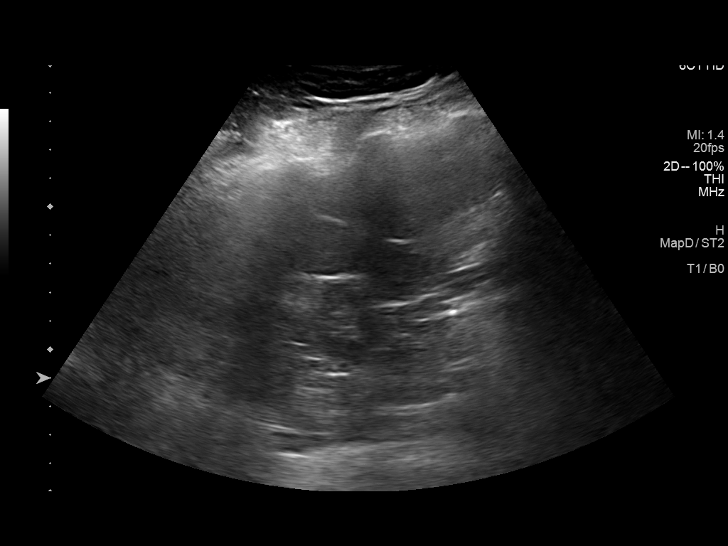
[im 14/38]
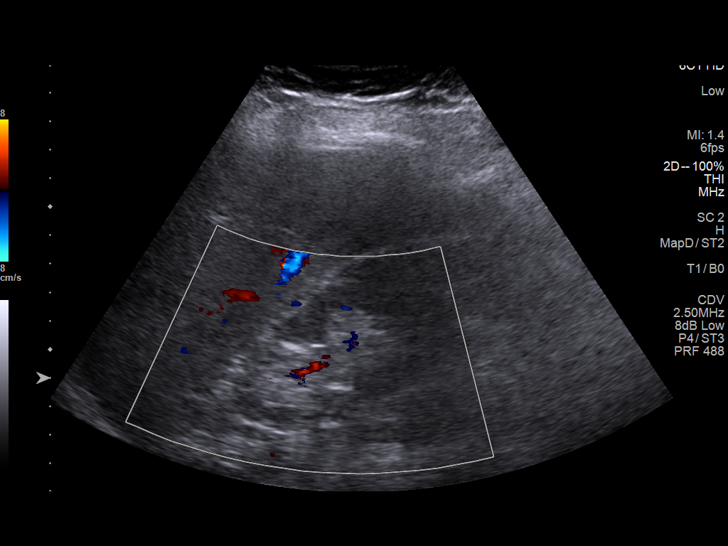
[im 17/38]
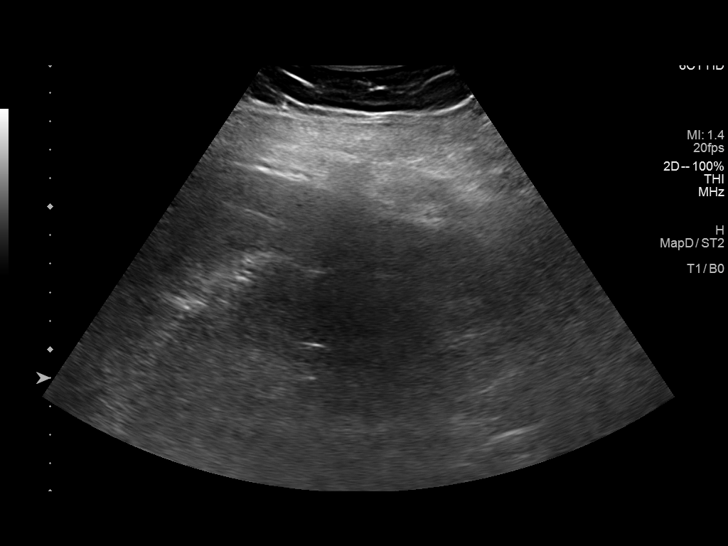
[im 21/38]
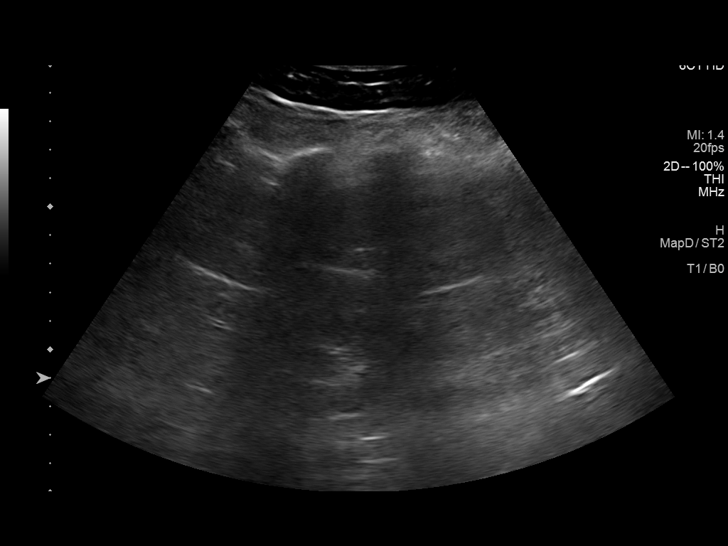
[im 24/38]
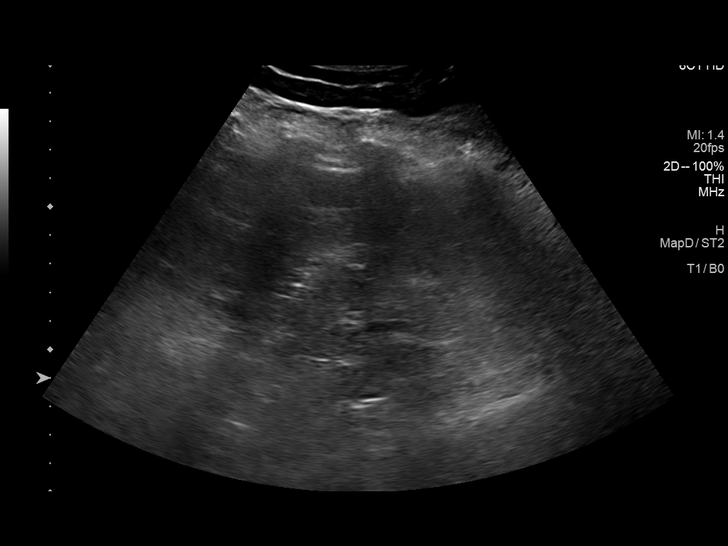
[im 25/38]
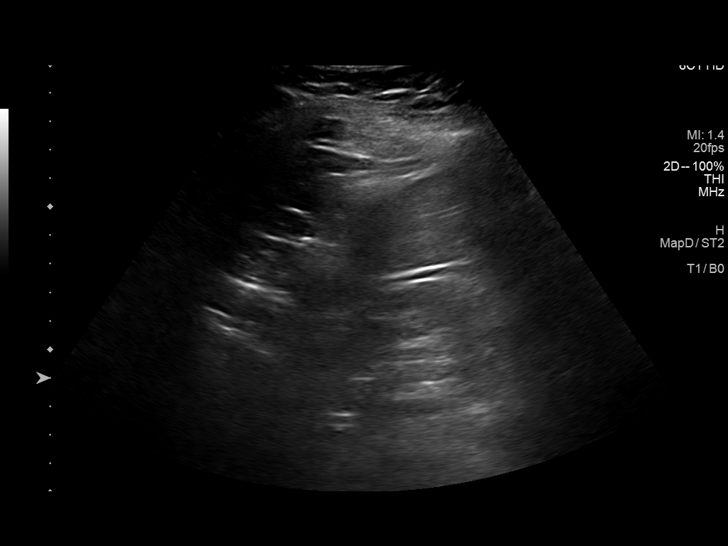
[im 28/38]
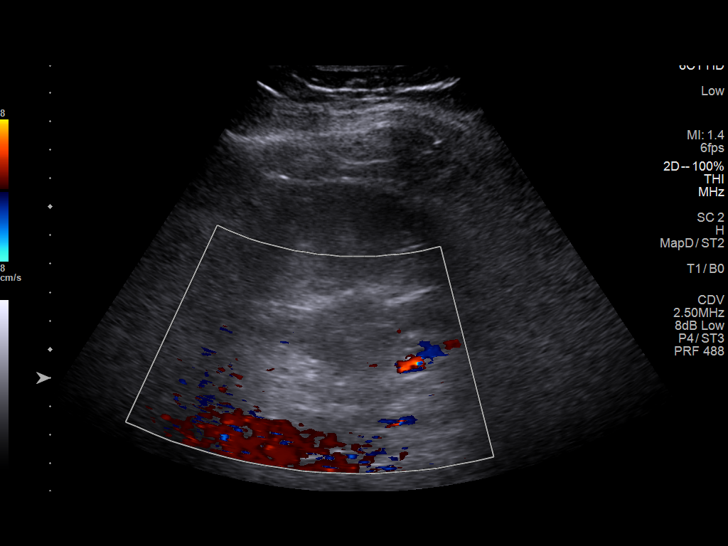
[im 31/38]
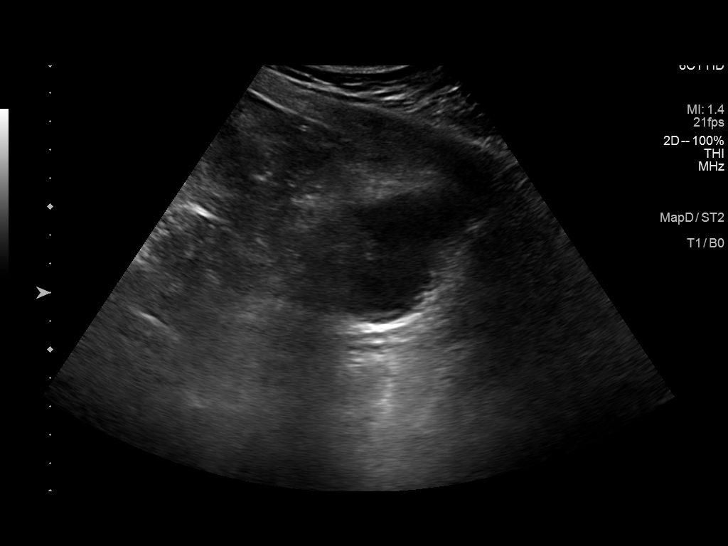
[im 34/38]
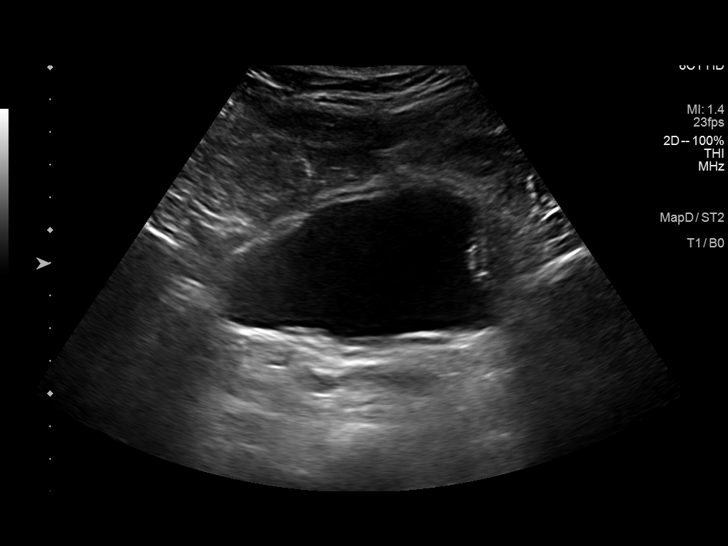
[im 38/38]
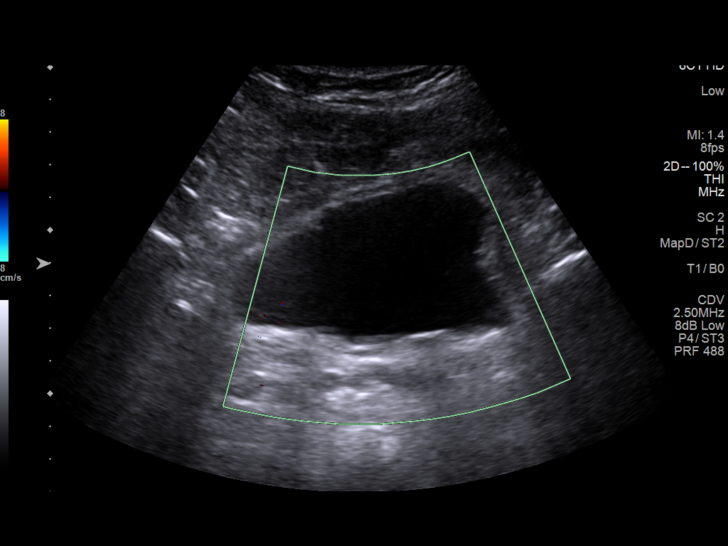

[14 of 25 positions shown; findings below may reference images not displayed]

FINDINGS: Right Kidney:

Renal measurements: 8.9 x 4.9 x 4.8 cm = volume: 108 mL. Evaluation
was limited by overlying bowel gas. The cortex appears thinned and
is echogenic. There is no hydronephrosis.

Left Kidney:

Renal measurements: 11.8 x 4.5 x 5.5 cm = volume: 158 mL. Evaluation
was limited by overlapping bowel gas and poor sonographic windows.
The cortex appears somewhat thinned and echogenic.

Bladder:

Appears normal for degree of bladder distention.

The bladder appears somewhat echogenic
IMPRESSION: 1. No acute sonographic abnormality detected.
2. Suboptimal evaluation of the kidneys secondary to overlying bowel
gas and poor sonographic windows.
3. There appears to be cortical thinning of both kidneys without
evidence of hydronephrosis.
4. Increased echogenicity of the liver which is nonspecific but can
be seen in patients with hepatic steatosis.

## 2021-01-04 ENCOUNTER — Other Ambulatory Visit: Payer: Self-pay | Admitting: Cardiology

## 2021-01-05 NOTE — Telephone Encounter (Signed)
Prescription refill request for Eliquis received. Indication:Afib Last office visit:upcoming [facility] Age: 81 Weight:91.4 kg  Prescription refilled

## 2021-01-06 ENCOUNTER — Other Ambulatory Visit: Payer: Self-pay | Admitting: Cardiology

## 2021-01-06 NOTE — Telephone Encounter (Signed)
Prescription refill request for Eliquis received. Indication:Afib Last office visit:upcoming [facility] Age: 81 Weight:91.4 kg  Prescription refilled

## 2021-01-18 ENCOUNTER — Ambulatory Visit (INDEPENDENT_AMBULATORY_CARE_PROVIDER_SITE_OTHER): Payer: Medicare Other | Admitting: Cardiology

## 2021-01-18 ENCOUNTER — Other Ambulatory Visit: Payer: Self-pay

## 2021-01-18 ENCOUNTER — Encounter: Payer: Self-pay | Admitting: Cardiology

## 2021-01-18 VITALS — BP 154/82 | HR 59 | Ht 71.0 in | Wt 212.0 lb

## 2021-01-18 DIAGNOSIS — I4821 Permanent atrial fibrillation: Secondary | ICD-10-CM

## 2021-01-18 DIAGNOSIS — I10 Essential (primary) hypertension: Secondary | ICD-10-CM

## 2021-01-18 DIAGNOSIS — N184 Chronic kidney disease, stage 4 (severe): Secondary | ICD-10-CM

## 2021-01-18 DIAGNOSIS — E785 Hyperlipidemia, unspecified: Secondary | ICD-10-CM | POA: Diagnosis not present

## 2021-01-18 DIAGNOSIS — I4891 Unspecified atrial fibrillation: Secondary | ICD-10-CM

## 2021-01-18 DIAGNOSIS — I872 Venous insufficiency (chronic) (peripheral): Secondary | ICD-10-CM

## 2021-01-18 DIAGNOSIS — D6869 Other thrombophilia: Secondary | ICD-10-CM

## 2021-01-18 MED ORDER — APIXABAN 2.5 MG PO TABS: ORAL_TABLET | ORAL | 1 refills | Status: DC

## 2021-01-18 MED ORDER — METOPROLOL SUCCINATE ER 100 MG PO TB24: 50.0000 mg | ORAL_TABLET | Freq: Two times a day (BID) | ORAL | 2 refills | Status: DC

## 2021-01-18 MED ORDER — FLECAINIDE ACETATE 50 MG PO TABS: ORAL_TABLET | ORAL | 11 refills | Status: DC

## 2021-01-18 NOTE — Progress Notes (Signed)
Primary Care Provider: Deland Pretty, MD Cardiologist: Glenetta Hew, MD Electrophysiologist: None  Clinic Note: Chief Complaint  Patient presents with   Follow-up    Annual.  Doing well   Atrial Fibrillation    Controlled.  Asymptomatic.  No bleeding    ===================================  ASSESSMENT/PLAN   Problem List Items Addressed This Visit       Cardiology Problems   Permanent atrial fibrillation (HCC) CHA2DS2Vasc Score 4, On Eliquis - Primary (Chronic)    Permanent A-fib, rate controlled on Toprol.  Relatively asymptomatic.  Occasionally feels his heart rate going up fast but nothing significant.  Mild exercise intolerance.  Plan: Change Toprol to 50 mg twice daily => goal is to allow resting heart rate in the 50s to 70s but able to get heart rate up to the 90s. If able to achieve target heart rate, stay on that dose. After 1 month on 50 mg twice daily Toprol (especially if not able to get heart rate up), stop a.m. dose. => Monitor blood pressures for 2 weeks with target range of 010U to 725D systolic and heart rate in the 80s at rest. For breakthrough tachycardia as well as take the additional 50 mg Continue Eliquis. For tachycardia: PRN flecainide 50 mg -> initial dose 100 mg and then 50 mg twice daily until heart rate returns to normal.      Relevant Medications   metoprolol succinate (TOPROL-XL) 100 MG 24 hr tablet   flecainide (TAMBOCOR) 50 MG tablet   Other Relevant Orders   EKG 12-Lead (Completed)   Dyslipidemia, goal LDL below 100 (Chronic)    Lipids are pretty well controlled based on most recent labs on 20 mg rosuvastatin.  Labs followed by PCP.      Relevant Medications   metoprolol succinate (TOPROL-XL) 100 MG 24 hr tablet   flecainide (TAMBOCOR) 50 MG tablet   Essential hypertension (Chronic)    BP is high today, but usually pretty well controlled at home based on his log.  Continue Toprol and amlodipine.  Low threshold to increase  amlodipine since to be are reducing Toprol dose.      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 Okay to hold Eliquis 2 to 3 days preop for procedures or surgeries.  No need to bridge.  Restart when safe postop.       Relevant Medications   metoprolol succinate (TOPROL-XL) 100 MG 24 hr tablet   flecainide (TAMBOCOR) 50 MG tablet     Other   Venous stasis dermatitis of both lower extremities (Chronic)    No further venous stasis ulcers.  Would not really want to push amlodipine up for this reason. Continue foot elevation and support stockings.      CKD (chronic kidney disease), stage IV (HCC)   ===================================   HPI:    Joseph Landry is a 81 y.o. male with a PMH with LONGSTANDING PERMANENT A. FIB, HTN and Venous Stasis Edema, CKD (3-4) who presents today for delayed annual  follow-up.  Joseph Landry was last seen on November 13, 2019 as a 46-month follow-up.  He had had a brief interval of pneumonia in May 2021, but had recovered well.  He had both cataract surgeries done in October 2021.  Had not had any issues with rapid A. fib.  He was able to do his yard work, but not fully back to baseline.  Still noted some exertional dyspnea if he "overdoes".  Usually if he walks too far/too fast or is rushing upstairs, usually if he is carrying something.  Still has in a day edema that goes down at night.  He noted to have a less stress being retired.  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  Was enjoying taking trips back and forth to the mountains and beach spending time with grandkids.  Just not quite as active walking as he used to.  He notes that he has put on some weight since retiring-eating out more .  Recent Hospitalizations: None  Reviewed  CV studies:    The following studies were reviewed today: (if available, images/films reviewed: From Epic Chart or Care Everywhere) None:  Interval History:   Joseph Landry returns today for annual follow-up.  He is doing well without any major issues.  Swelling well controlled.  As long as he is not standing for prolonged periods of time seated for long peers of time/immobile.  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  He actually is happy to announce that he has started back working out.  Just about every week day.  He works out on the treadmill for about a mile and that does arm and leg exercises on a machine for 45 minutes or so.  He says with his exercise he does feel a whole lot better.  Energy level is improved.  Unfortunately, his weight has not gone down but he still frustrated about that.  He has had occasional spells where he feels his heart rate go up to the low 100s but usually will go back into the 80s.  He says his blood pressure is usually much better controlled than it is here indicated that it is usually ranging from 113/70-135/76 (he brought a extensive BP log confirming this.  CV Review of Symptoms (Summary) Cardiovascular ROS: no chest pain or dyspnea on exertion positive for - -mild end of day edema.  Occasional rapid heart rate palpitations lasting a few minutes.  (Not symptomatic of A-fib) negative for - irregular heartbeat, orthopnea, paroxysmal nocturnal dyspnea, shortness of breath, or lightheadedness, dizziness or  wooziness, syncope/near syncope or TIA/amaurosis fugax, claudication  REVIEWED OF SYSTEMS   Review of Systems  Constitutional:  Negative for malaise/fatigue (Exercise intolerance has notably improved now that he is actually working out.) and weight loss (Despite his efforts).  HENT:  Negative for congestion and nosebleeds.   Respiratory:  Negative for cough, shortness of breath and wheezing.   Cardiovascular:  Positive for leg swelling (Trivial end of day).  Gastrointestinal:  Negative for blood in stool and melena.  Genitourinary:  Negative for hematuria.  Musculoskeletal:  Positive for joint pain. Negative for back pain.  Neurological:  Positive for dizziness (Sometimes if he stands up too fast). Negative for seizures, loss of consciousness and weakness.  Psychiatric/Behavioral:  Negative for depression and  memory loss. The patient is not nervous/anxious and does not have insomnia.   Mild size intolerance, joint pain.  I have reviewed and (if needed) personally updated the patient's problem list, medications, allergies, past medical and surgical history, social and family history.   PAST MEDICAL HISTORY   Past Medical History:  Diagnosis Date   Arthritis    Dyslipidemia     on  simvastatin    Essential hypertension    Labile; often elevated in clinic visits, but never at home   Kidney disease    Permanent atrial fibrillation ([place])    Rate controlled with beta blocker; anticoagulation with Eliquis   Ulcer of ankle ([place])    right ankle; s/p I&D   Varicose veins    s/p R GSFA Endovenous Ablation   Wears glasses    Wears hearing aid    both ears   Wears partial dentures     PAST SURGICAL HISTORY   Past Surgical History:  Procedure Laterality Date   APPLICATION OF A-CELL OF EXTREMITY Right 12/31/2013   Procedure: PLACEMENT OF ACELL RIGHT MEDIAL ANKLE;  Surgeon: Theodoro Kos, DO;  Location: [place];  Service: Plastics;  Laterality: Right;   COLONOSCOPY      ENDOVENOUS ABLATION SAPHENOUS VEIN W/ LASER Right 01-14-2014   EVLA right greater saphenous vein by Curt Jews MD   event monitor  08/22/2011-09/20/2011   shows a fib- startedon FLECAINDE   I & D EXTREMITY Right 12/31/2013   Procedure: IRRIGATION AND DEBRIDEMENT RIGHT ANKLE WOUND;  Surgeon: Theodoro Kos, DO;  Location: [place];  Service: Plastics;  Laterality: Right;   NASAL SINUS SURGERY     NM [facility] - [place]  August 2012   No infarct or ischemia.   TRANSTHORACIC ECHOCARDIOGRAM  August 2012   Normal LV size and function, normal LA size, EF> 55%.    Immunization History  Administered Date(s) Administered   PFIZER(Purple Top)SARS-COV-2 Vaccination 03/30/2019, 04/21/2019, 01/08/2020    MEDICATIONS/ALLERGIES   Current Meds  Medication Sig   amLODipine (NORVASC) 5 MG tablet TAKE 1 TABLET(5 MG) BY MOUTH DAILY   folic acid (FOLVITE) 283 MCG tablet Take 400 mcg by mouth daily.    Omega-3 Fatty Acids (FISH OIL) 1000 MG CAPS Take by mouth.   rosuvastatin (CRESTOR) 20 MG tablet Take 20 mg by mouth daily.   Vitamin D, Cholecalciferol, 50 MCG (2000 UT) CAPS Take 1 capsule by mouth daily.   []  apixaban (ELIQUIS) 2.5 MG TABS tablet TAKE 1 TABLET(2.5 MG) BY MOUTH TWICE DAILY   []  metoprolol succinate (TOPROL-XL) 100 MG 24 hr tablet Take 1 tablet by mouth daily.    Allergies  Allergen Reactions   Multaq [Dronedarone] Nausea Only    Made pt feel bad and worn out     SOCIAL HISTORY/FAMILY HISTORY   Reviewed in Epic:  Pertinent findings:  Social History   Tobacco Use   Smoking status: Never   Smokeless tobacco: Never  Vaping Use   Vaping Use: Never used  Substance Use Topics   Alcohol use: Yes    Alcohol/week: 0.0 standard drinks    Comment: occ   Drug use: No   Social History   Social History Narrative   He is a married father of 2, grandfather of 1.     An occasional social alcoholic drink but does not smoke.       He and his wife recently  moved into apartment last winter.  This caused him  to get out of his exercise routine, and he is not yet gotten back into it.      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      In the fall 2022, he began to go back to the gym: About 1 mile a treadmill and 45 minutes on the machines with arm and leg exercises.    OBJCTIVE -PE, EKG, labs   Wt Readings from Last 3 Encounters:  01/18/21 212 lb (96.2 kg)  11/13/19 201 lb 9.6 oz (91.4 kg)  05/06/19 197 lb 9.6 oz (89.6 kg)    Physical Exam: BP (!) 154/82    Pulse (!) 59    Ht 5\' 11"  (1.803 m)    Wt 212 lb (96.2 kg)    SpO2 97%    BMI 29.57 kg/m  Physical Exam Constitutional:      General: He is not in acute distress.    Appearance: Normal appearance. He is normal weight. He is not ill-appearing or toxic-appearing.  HENT:     Head: Normocephalic and atraumatic.  Neck:     Vascular: No carotid bruit or JVD.  Cardiovascular:     Rate and Rhythm: Normal rate. Rhythm irregularly irregular. No extrasystoles are present.    Chest Wall: PMI is not displaced.     Pulses: Normal pulses.     Heart sounds: Normal heart sounds. No murmur heard.   No gallop.  Pulmonary:     Effort: Pulmonary effort is normal. No respiratory distress.     Breath sounds: Normal breath sounds. No wheezing, rhonchi or rales.  Chest:     Chest wall: No tenderness.  Musculoskeletal:        General: Swelling (Trivial 1+ bilaterally) present. Normal range of motion.     Cervical back: Normal range of motion and neck supple.  Skin:    General: Skin is warm and dry.  Neurological:     General: No focal deficit present.     Mental Status: He is alert and oriented to person, place, and time.     Gait: Gait normal.  Psychiatric:        Mood and Affect: Mood normal.        Behavior: Behavior normal.        Thought Content: Thought content normal.        Judgment: Judgment normal.     Adult ECG Report  Rate: 59;  Rhythm: atrial  fibrillation-rate controlled.  Normal axis, intervals and durations.;   Narrative Interpretation: Stable  Recent Labs: Due for labs to be checked in June. 06/07/2020: TC 140, TG 139, HDL 35, LDL 60. 12/21/2020: Hgb 14.1. Cr 1.470. No results found for: CHOL, HDL, LDLCALC, LDLDIRECT, TRIG, CHOLHDL Lab Results  Component Value Date   CREATININE 1.64 (H) 05/06/2019   BUN 26 (H) 05/06/2019   NA 134 (L) 05/06/2019   K 3.8 05/06/2019   CL 103 05/06/2019   CO2 22 05/06/2019   CBC Latest Ref Rng & Units 05/06/2019 12/31/2013  WBC 4.0 - 10.5 K/uL 12.0(H) -  Hemoglobin 13.0 - 17.0 g/dL 13.4 13.0  Hematocrit 39.0 - 52.0 % 37.9(L) -  Platelets 150 - 400 K/uL 133(L) -    No results found for: HGBA1C No results found for: TSH  ==================================================  COVID-19 Education: The signs and symptoms of COVID-19 were discussed with the patient and how to seek care for testing (follow up with PCP or arrange E-visit).    I  spent a total of 18 minutes with the patient spent in direct patient consultation.  Additional time spent with chart review  / charting (studies, outside notes, etc): 35min Total Time: 41 min  Current medicines are reviewed at length with the patient today.  (+/- concerns) none  This visit occurred during the SARS-CoV-2 public health emergency.  Safety protocols were in place, including screening questions prior to the visit, additional usage of staff PPE, and extensive cleaning of exam room while observing appropriate contact time as indicated for disinfecting solutions.  Notice: This dictation was prepared with Dragon dictation along with smart phrase technology. Any transcriptional errors that result from this process are unintentional and may not be corrected upon review.  Studies Ordered:   Orders Placed This Encounter  Procedures   EKG 12-Lead    Patient Instructions / Medication Changes & Studies & Tests Ordered   Patient Instructions   Medication Instructions:      Decrease  Metoprolol succinate  to 1/2 tablet of 100 mg  ( equal 50 mg ) twice a day    Monitor  blood pressure  and heart rate  -  like  resting heart rate to be in the  50's - 27 's   While walking  try to get heart rate above the 48's    Okay yo stay on this dose.    After one month  ( Feb 18 , 2023 ) -- only take 1/2 tablet of  100 mg metoprolol ( equal 50 mg )  at bedtime . Stop taking the morning dose.  For 2 weeks  monitor blood pressure  arrange should be between 120-140/? Systolic ( top number) , heart 80's at rest then stay with this dose.    Refill Take if you have an episode of fast heart rate - Flecainide 50 mg   initial dose take 2 tablet first dose , if  conitnue take 50 mg twice a day until heat rate returns to normal    *If you need a refill on your cardiac medications before your next appointment, please call your pharmacy*   Lab Work:  Not needed   Testing/Procedures: Not needed    Follow-Up: At [facility], you and your health needs are our priority.  As part of our continuing mission to provide you with exceptional heart care, we have created designated Provider Care Teams.  These Care Teams include your primary Cardiologist (physician) and Advanced Practice Providers (APPs -  Physician Assistants and Nurse Practitioners) who all work together to provide you with the care you need, when you need it.     Your next appointment:   6 month(s)  The format for your next appointment:   In Person  Provider:   Fabian Sharp, PA-C, Sande Rives, PA-C, Kennith Maes, or Jory Sims, DNP, ANP    Then, Glenetta Hew, MD will plan to see you again in 12 month(s).   Other Instructions  Your physician recommends that you schedule a follow-up appointment in 6 weeks with Dr Sanjuana Kava, M.D., M.S. Interventional Cardiologist   Pager # [phone] Phone # [phone] [address] [place], [place] [address]   Thank you for choosing Heartcare at [facility]!!

## 2021-01-18 NOTE — Patient Instructions (Addendum)
Medication Instructions:      Decrease  Metoprolol succinate  to 1/2 tablet of 100 mg  ( equal 50 mg ) twice a day    Monitor  blood pressure  and heart rate  -  like  resting heart rate to be in the  50's - 68 's   While walking  try to get heart rate above the 15's    Okay yo stay on this dose.    After one month  ( Feb 18 , 2023 ) -- only take 1/2 tablet of  100 mg metoprolol ( equal 50 mg )  at bedtime . Stop taking the morning dose.  For 2 weeks  monitor blood pressure  arrange should be between 120-140/? Systolic ( top number) , heart 80's at rest then stay with this dose.    Refill Take if you have an episode of fast heart rate - Flecainide 50 mg   initial dose take 2 tablet first dose , if  conitnue take 50 mg twice a day until heat rate returns to normal    *If you need a refill on your cardiac medications before your next appointment, please call your pharmacy*   Lab Work:  Not needed   Testing/Procedures: Not needed    Follow-Up: At [facility], you and your health needs are our priority.  As part of our continuing mission to provide you with exceptional heart care, we have created designated Provider Care Teams.  These Care Teams include your primary Cardiologist (physician) and Advanced Practice Providers (APPs -  Physician Assistants and Nurse Practitioners) who all work together to provide you with the care you need, when you need it.     Your next appointment:   6 month(s)  The format for your next appointment:   In Person  Provider:   Fabian Sharp, PA-C, Sande Rives, PA-C, Kennith Maes, or Jory Sims, DNP, ANP    Then, Glenetta Hew, MD will plan to see you again in 12 month(s).   Other Instructions  Your physician recommends that you schedule a follow-up appointment in 6 weeks with Dr Ellyn Hack

## 2021-02-07 ENCOUNTER — Other Ambulatory Visit: Payer: Self-pay | Admitting: Cardiology

## 2021-02-07 NOTE — Telephone Encounter (Signed)
Prescription refill request for Eliquis received. Indication: Afib  Last office visit:01/18/21 Ellyn Hack)  Scr: 1.5 (12/21/20 via [place])  Age: 81 Weight: 96.2kg  Appropriate dose and refill sent to requested pharmacy.

## 2021-02-22 ENCOUNTER — Encounter: Payer: Self-pay | Admitting: Cardiology

## 2021-02-22 DIAGNOSIS — I4891 Unspecified atrial fibrillation: Secondary | ICD-10-CM | POA: Insufficient documentation

## 2021-02-22 DIAGNOSIS — D6869 Other thrombophilia: Secondary | ICD-10-CM | POA: Insufficient documentation

## 2021-02-22 NOTE — Assessment & Plan Note (Signed)
No further venous stasis ulcers.  Would not really want to push amlodipine up for this reason. Continue foot elevation and support stockings.

## 2021-02-22 NOTE — Assessment & Plan Note (Signed)
Lipids are pretty well controlled based on most recent labs on 20 mg rosuvastatin.  Labs followed by PCP.

## 2021-02-22 NOTE — Assessment & Plan Note (Signed)
BP is high today, but usually pretty well controlled at home based on his log.  Continue Toprol and amlodipine.  Low threshold to increase amlodipine since to be are reducing Toprol dose.

## 2021-02-22 NOTE — Assessment & Plan Note (Addendum)
Permanent A-fib, rate controlled on Toprol.  Relatively asymptomatic.  Occasionally feels his heart rate going up fast but nothing significant.  Mild exercise intolerance.  Plan:  Change Toprol to 50 mg twice daily => goal is to allow resting heart rate in the 50s to 70s but able to get heart rate up to the 90s.  If able to achieve target heart rate, stay on that dose.  After 1 month on 50 mg twice daily Toprol (especially if not able to get heart rate up), stop a.m. dose. => Monitor blood pressures for 2 weeks with target range of 022M to 406R systolic and heart rate in the 80s at rest.  For breakthrough tachycardia as well as take the additional 50 mg  Continue Eliquis.  For tachycardia: PRN flecainide 50 mg -> initial dose 100 mg and then 50 mg twice daily until heart rate returns to normal.

## 2021-02-22 NOTE — Assessment & Plan Note (Addendum)
This patients CHA2DS2-VASc Score and unadjusted Ischemic Stroke Rate (% per year) is equal to 4.8 % stroke rate/year from a score of 4  Above score calculated as 1 point each if present [CHF, HTN, DM, Vascular=MI/PAD/Aortic Plaque, Age if 65-74, or Male] Above score calculated as 2 points each if present [Age > 75, or Stroke/TIA/TE]  Continue Eliquis.  Okay to hold Eliquis 2 to 3 days preop for procedures or surgeries.  No need to bridge.  Restart when safe postop.

## 2021-02-28 ENCOUNTER — Telehealth: Payer: Self-pay | Admitting: *Deleted

## 2021-02-28 NOTE — Telephone Encounter (Signed)
Called spoke to  patient .   Request patient if he could change his appointment  to March 13 , 2023 at  1:40 pm .  Patient is in agreement to the change time slot.

## 2021-03-13 ENCOUNTER — Encounter: Payer: Self-pay | Admitting: Cardiology

## 2021-03-13 ENCOUNTER — Ambulatory Visit: Payer: Medicare Other | Admitting: Cardiology

## 2021-03-13 ENCOUNTER — Ambulatory Visit (INDEPENDENT_AMBULATORY_CARE_PROVIDER_SITE_OTHER): Payer: Medicare Other | Admitting: Cardiology

## 2021-03-13 ENCOUNTER — Other Ambulatory Visit: Payer: Self-pay

## 2021-03-13 VITALS — BP 150/84 | HR 48 | Ht 72.0 in | Wt 207.8 lb

## 2021-03-13 DIAGNOSIS — I10 Essential (primary) hypertension: Secondary | ICD-10-CM | POA: Diagnosis not present

## 2021-03-13 DIAGNOSIS — D6869 Other thrombophilia: Secondary | ICD-10-CM | POA: Diagnosis not present

## 2021-03-13 DIAGNOSIS — N183 Chronic kidney disease, stage 3 unspecified: Secondary | ICD-10-CM

## 2021-03-13 DIAGNOSIS — I872 Venous insufficiency (chronic) (peripheral): Secondary | ICD-10-CM

## 2021-03-13 DIAGNOSIS — I4821 Permanent atrial fibrillation: Secondary | ICD-10-CM

## 2021-03-13 DIAGNOSIS — E785 Hyperlipidemia, unspecified: Secondary | ICD-10-CM | POA: Diagnosis not present

## 2021-03-13 DIAGNOSIS — I4891 Unspecified atrial fibrillation: Secondary | ICD-10-CM

## 2021-03-13 NOTE — Patient Instructions (Signed)
Medication Instructions:  ? ? Continue with  Metoprolol  succinate  50 mg ( 1/2 tablet of 100 mg) at bedtime   ? ? ?*If you need a refill on your cardiac medications before your next appointment, please call your pharmacy* ? ? ?Lab Work: ? BMP today  ? ?If you have labs (blood work) drawn today and your tests are completely normal, you will receive your results only by: ?MyChart Message (if you have MyChart) OR ?A paper copy in the mail ?If you have any lab test that is abnormal or we need to change your treatment, we will call you to review the results. ? ? ?Testing/Procedures: ? ?Not needed  ? ?Follow-Up: ?At [facility], you and your health needs are our priority.  As part of our continuing mission to provide you with exceptional heart care, we have created designated Provider Care Teams.  These Care Teams include your primary Cardiologist (physician) and Advanced Practice Providers (APPs -  Physician Assistants and Nurse Practitioners) who all work together to provide you with the care you need, when you need it. ? ?  ? ?Your next appointment:   ?Keep recall appointment for Jan 2024   ? ?The format for your next appointment:   ?In Person ? ?Provider:   ?Glenetta Hew, MD  ? ? ?

## 2021-03-13 NOTE — Assessment & Plan Note (Signed)
Doing well with no further ulcers from venous stasis.  Continues to wear support stockings.  Edema well controlled.  Not on diuretic. ?

## 2021-03-13 NOTE — Assessment & Plan Note (Signed)
Followed by nephrology.  Apparently had labs checked more recently, but the most recent labs last year from December 2021.  Hemoglobin was stable.  Creatinine improved down to 1.47 from 1.82. ? ?In order to appropriately dose Eliquis, we will recheck chemistry panel. ?

## 2021-03-13 NOTE — Progress Notes (Signed)
Primary Care Provider: Deland Pretty, MD Cardiologist: Glenetta Hew, MD Electrophysiologist: None  Clinic Note: Chief Complaint  Patient presents with   Follow-up    6 week --doing well.  Exercising more @gym  ;TM & arm-leg machine > 1hr.      Hypertension    BP log attached   Atrial Fibrillation    ===================================  ASSESSMENT/PLAN   Problem List Items Addressed This Visit       Cardiology Problems   Permanent atrial fibrillation (HCC) CHA2DS2Vasc Score 4, On Eliquis - Primary (Chronic)    Permanent rate controlled A-fib.  Heart rate range is below her baseline regardless of having reduced his Toprol dose down to 50 mg daily.  Minimal exercise intolerance now that he has become more active.  Certainly cannot increase beta-blocker up any further now.  No real active signs of cardiac incompetence.  Plan: Continue current dose of Toprol 50 mg daily for now => although anticipate we may need to back down on this. PRN flecainide per protocol for breakthrough atrial fibrillation. Continue anticoagulation with Eliquis, however renal function seems improved.  We may need to increase back up to 5 mg twice daily.  We will need to reassess BMP, based on those results, potentially increase back to 5 mg twice daily Eliquis.      Relevant Medications   metoprolol succinate (TOPROL-XL) 100 MG 24 hr tablet   Other Relevant Orders   EKG [id number]   Basic metabolic panel   Dyslipidemia, goal LDL below 100 (Chronic)    Labs been followed by PCP.  Is due for lipid recheck this June.  Was well controlled as of June 2022 with LDL of 60.  Continue rosuvastatin.      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  Dose was reduced because of renal insufficiency.  With follow-up labs being slightly better with improved function, we may be able to adjust back to 5 mg twice daily.  Check BMP-based on renal function, may adjust back to 5 mg twice daily Eliquis.  (Based on last labs, should probably be okay.  Will discuss with our pharmacist team).      Relevant Medications   metoprolol succinate (TOPROL-XL) 100 MG 24 hr tablet     Other   Venous stasis dermatitis of both lower extremities (Chronic)    Doing well with no further ulcers from venous stasis.  Continues to wear support stockings.  Edema well controlled.  Not on diuretic.      CKD (chronic kidney disease), stage III (HCC) (Chronic)    Followed by nephrology.  Apparently had labs checked more recently, but the most recent labs last year from December 2021.  Hemoglobin was stable.  Creatinine improved down to 1.47 from 1.82.  In order to appropriately dose Eliquis, we will recheck chemistry panel.      Relevant Orders   Basic metabolic panel  ===================================   HPI:    Joseph Landry is a 81 y.o. male with a PMH with LONGSTANDING PERMANENT A. FIB, HTN and Venous Stasis Edema, CKD (3-4) who presents today for delayed annual follow-up.  Joseph Landry was last seen on January 18, 2021.  Was doing well.  No major issues.  Swelling controlled, as long as he does not spend long hours standing/immobile.  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  Just gone back to starting to work out at [employer].  He does a treadmill for about a mile or so and then does arm exercises in the machines for 45 minutes.  He feels much better after he does exercise.  Energy level improving, just upset about not losing weight.  Occasional spells of heart rates going into the 100s, but usually in the 80s.  BP also usually better controlled than it was in the clinic.  Usually  average range 110/7op -135/75 mmHg. Change Toprol to 50 mg twice daily.  After 1 month (especially if not able to increase heart rate), stop the morning dose.  Monitor BP for 2 weeks with target SBP 120s to 140s with heart rates in the 80s.  Okay to take additional dose as needed. Continue PRN flecainide  Recent Hospitalizations: None  Reviewed  CV studies:    The following studies were reviewed today: (if available, images/films reviewed: From Epic Chart or Care Everywhere) None:  Interval History:   Joseph Landry returns today for close follow-up.  He is doing well.  No major issues.  He is still very active.  He has been going to the gym doing about an hour on the arm leg exercise machine in addition to a couple miles on the treadmill.  He is doing this at least 5 days a week.  He says this is making him feel a lot better.  He is got much better energy level.  He also goes almost the lawn and goes on walks on his own.  He says that he can get his heart rate into the 90s although somewhat difficult.  Usually when he exerts himself his heart rate is getting to the high 80s.  On occasion he does not get his heart rate in the 100 range.  Despite this he really denies any signs of fatigue or lack of get up and go.  He was able to tolerate reducing his metoprolol dose down to 50 mg daily after initially going to 50 twice daily.  In doing so, he really did not notice that much of a change in his heart rate or blood pressure.  He brought a blood pressure and heart rate log in with him that shows heart rate ranged from 41-65 beats a minute and a blood pressure range of 100 3 over 29mmHg to 152 over 84 mmHg.  For the most part the average systolic blood pressures are in the 125 to 135 mmHg range.  He says his leg swelling is pretty well controlled.  He does wear support socks routinely.  He has not yet had labs checked by PCP.  Should be checked in about June timeframe.  He says that he recently had his  creatinine checked by nephrology.  This was actually done in December.-Creatinine was 1.47 (down from 1.82)  CV Review of Symptoms (Summary) Cardiovascular ROS: no chest pain or dyspnea on exertion positive for - edema, palpitations, and -mild end of day edema.  Occasional rapid heart rate palpitations lasting a few minutes.  (Not symptomatic of A-fib) negative for - irregular heartbeat, orthopnea, paroxysmal nocturnal dyspnea, shortness of breath, or lightheadedness, dizziness or wooziness, syncope/near syncope or TIA/amaurosis fugax, claudication; melena, hematochezia, hematuria or epistaxis  REVIEWED OF SYSTEMS   Review of  Systems  Constitutional:  Negative for malaise/fatigue (Seems to be tolerating his workouts much more effectively.  No significant exertional dyspnea.Marland Kitchen) and weight loss (He was happy to say that at home he weighed just over 200 pounds in the morning without moving on.  Its about an 8 pound loss).  HENT:  Negative for congestion and nosebleeds.   Respiratory:  Negative for cough, shortness of breath and wheezing.   Cardiovascular:  Positive for leg swelling (Trivial end of day-usually controlled with support stockings).  Gastrointestinal:  Negative for blood in stool and melena.  Genitourinary:  Negative for dysuria and hematuria.       He does have occasional nocturia about 3-4 times at the most  Musculoskeletal:  Positive for joint pain. Negative for back pain and myalgias.  Neurological:  Positive for dizziness (Sometimes if he stands up too fast). Negative for tingling, seizures, loss of consciousness, weakness and headaches.  Psychiatric/Behavioral:  Negative for depression and memory loss. The patient is not nervous/anxious and does not have insomnia.   Mild size intolerance, joint pain.  I have reviewed and (if needed) personally updated the patient's problem list, medications, allergies, past medical and surgical history, social and family history.   PAST MEDICAL  HISTORY   Past Medical History:  Diagnosis Date   Arthritis    Dyslipidemia     on  simvastatin    Essential hypertension    Labile; often elevated in clinic visits, but never at home   Kidney disease    Permanent atrial fibrillation ([place])    Rate controlled with beta blocker; anticoagulation with Eliquis   Ulcer of ankle ([place])    right ankle; s/p I&D   Varicose veins    s/p R GSFA Endovenous Ablation   Wears glasses    Wears hearing aid    both ears   Wears partial dentures     PAST SURGICAL HISTORY   Past Surgical History:  Procedure Laterality Date   APPLICATION OF A-CELL OF EXTREMITY Right 12/31/2013   Procedure: PLACEMENT OF ACELL RIGHT MEDIAL ANKLE;  Surgeon: Theodoro Kos, DO;  Location: [place];  Service: Plastics;  Laterality: Right;   COLONOSCOPY     ENDOVENOUS ABLATION SAPHENOUS VEIN W/ LASER Right 01-14-2014   EVLA right greater saphenous vein by Curt Jews MD   event monitor  08/22/2011-09/20/2011   shows a fib- startedon FLECAINDE   I & D EXTREMITY Right 12/31/2013   Procedure: IRRIGATION AND DEBRIDEMENT RIGHT ANKLE WOUND;  Surgeon: Theodoro Kos, DO;  Location: [place];  Service: Plastics;  Laterality: Right;   NASAL SINUS SURGERY     NM MYOVIEW LTD - [place]  August 2012   No infarct or ischemia.   TRANSTHORACIC ECHOCARDIOGRAM  August 2012   Normal LV size and function, normal LA size, EF> 55%.    Immunization History  Administered Date(s) Administered   PFIZER(Purple Top)SARS-COV-2 Vaccination 03/30/2019, 04/21/2019, 01/08/2020    MEDICATIONS/ALLERGIES   Current Meds  Medication Sig   amLODipine (NORVASC) 5 MG tablet TAKE 1 TABLET(5 MG) BY MOUTH DAILY   folic acid (FOLVITE) 846 MCG tablet Take 400 mcg by mouth daily.    Omega-3 Fatty Acids (FISH OIL) 1000 MG CAPS Take by mouth.   rosuvastatin (CRESTOR) 20 MG tablet Take 20 mg by mouth daily.   Vitamin D, Cholecalciferol, 50 MCG (2000 UT) CAPS Take 1 capsule  by mouth daily.   []  apixaban (ELIQUIS) 2.5 MG TABS tablet TAKE 1 TABLET(2.5 MG)  BY MOUTH TWICE DAILY   []  metoprolol succinate (TOPROL-XL) 100 MG 24 hr tablet Take 1 tablet by mouth daily.    Allergies  Allergen Reactions   Multaq [Dronedarone] Nausea Only    Made pt feel bad and worn out     SOCIAL HISTORY/FAMILY HISTORY   Reviewed in Epic:  Pertinent findings:  Social History   Tobacco Use   Smoking status: Never   Smokeless tobacco: Never  Vaping Use   Vaping Use: Never used  Substance Use Topics   Alcohol use: Yes    Alcohol/week: 0.0 standard drinks    Comment: occ   Drug use: No   Social History   Social History Narrative   He is a married father of 2, grandfather of 1.     An occasional social alcoholic drink but does not smoke.       He and his wife recently moved into apartment last winter.  This caused him to get out of his exercise routine, and he is not yet gotten back into it.      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   He now enjoys taking trips back and forth to the mountains and beach spending time with grandkids.  Just not quite as active walking as he used to.  He notes that he has put on some weight since retiring-eating out more .      In the fall 2022, he began to go back to the gym: About 1 mile a treadmill and 45 minutes on the machines with arm and leg exercises.    OBJCTIVE -PE, EKG, labs   Wt Readings from Last 3 Encounters:  03/13/21 207 lb 12.8 oz (94.3 kg)  01/18/21 212 lb (96.2 kg)  11/13/19 201 lb 9.6 oz (91.4 kg)    Physical Exam: BP (!) 150/84    Pulse (!) 48    Ht 6' (1.829 m)    Wt 207 lb 12.8 oz (94.3 kg)    SpO2 95%    BMI 28.18 kg/m  Physical Exam Vitals reviewed.  Constitutional:      General: He is not in acute distress.    Appearance: Normal appearance. He is normal weight. He is not ill-appearing (Well-nourished, well-groomed.  Mild truncal obesity.) or toxic-appearing.  HENT:      Head: Normocephalic and atraumatic.  Cardiovascular:     Rate and Rhythm: Bradycardia present. Rhythm irregularly irregular.     Chest Wall: PMI is not displaced.     Pulses: Normal pulses.     Heart sounds: Normal heart sounds, S1 normal and S2 normal. No murmur heard.   No friction rub. No gallop.  Pulmonary:     Effort: Pulmonary effort is normal. No respiratory distress.     Breath sounds: Normal breath sounds.  Musculoskeletal:        General: No swelling. Normal range of motion.  Skin:    General: Skin is warm and dry.  Neurological:     General: No focal deficit present.     Mental Status: He is alert and oriented to person, place, and time.     Gait: Gait normal.  Psychiatric:        Mood and Affect: Mood normal.        Behavior: Behavior normal.        Thought Content: Thought content normal.        Judgment: Judgment normal.     Adult ECG Report  Rate: 59;  Rhythm: atrial fibrillation-slow ventricular rate.  Normal axis, intervals and durations.;   Narrative Interpretation: Stable-right lower  Recent Labs: Due for labs to be checked in June. 06/07/2020: TC 140, TG 139, HDL 35, LDL 60. 12/21/2020: Hgb 14.1. Cr 1.470.  (As of 11/17/2019- Cr 1.82.)  ==================================================  COVID-19 Education: The signs and symptoms of COVID-19 were discussed with the patient and how to seek care for testing (follow up with PCP or arrange E-visit).    I spent a total of 27 minutes with the patient spent in direct patient consultation.  Additional time spent with chart review  / charting (studies, outside notes, etc): 26 min Total Time: 53 min  Current medicines are reviewed at length with the patient today.  (+/- concerns) none  This visit occurred during the SARS-CoV-2 public health emergency.  Safety protocols were in place, including screening questions prior to the visit, additional usage of staff PPE, and extensive cleaning of exam room while observing  appropriate contact time as indicated for disinfecting solutions.  Notice: This dictation was prepared with Dragon dictation along with smart phrase technology. Any transcriptional errors that result from this process are unintentional and may not be corrected upon review.  Studies Ordered:   Orders Placed This Encounter  Procedures   Basic metabolic panel   EKG [id number]    Patient Instructions / Medication Changes & Studies & Tests Ordered   Patient Instructions  Medication Instructions:    Continue with  Metoprolol  succinate  50 mg ( 1/2 tablet of 100 mg) at bedtime     *If you need a refill on your cardiac medications before your next appointment, please call your pharmacy*   Lab Work:  BMP today   If you have labs (blood work) drawn today and your tests are completely normal, you will receive your results only by: MyChart Message (if you have MyChart) OR A paper copy in the mail If you have any lab test that is abnormal or we need to change your treatment, we will call you to review the results.   Testing/Procedures:  Not needed   Follow-Up: At [facility], you and your health needs are our priority.  As part of our continuing mission to provide you with exceptional heart care, we have created designated Provider Care Teams.  These Care Teams include your primary Cardiologist (physician) and Advanced Practice Providers (APPs -  Physician Assistants and Nurse Practitioners) who all work together to provide you with the care you need, when you need it.     Your next appointment:   Keep recall appointment for Jan 2024    The format for your next appointment:   In Person  Provider:   Glenetta Hew, MD       Glenetta Hew, M.D., M.S. Interventional Cardiologist   Pager # [phone] Phone # [phone] [address] [place], [place] [address]   Thank you for choosing Heartcare at [facility]!!

## 2021-03-13 NOTE — Assessment & Plan Note (Signed)
Labs been followed by PCP.  Is due for lipid recheck this June.  Was well controlled as of June 2022 with LDL of 60. ? ?Continue rosuvastatin. ?

## 2021-03-13 NOTE — Assessment & Plan Note (Signed)
CHA2DS2-VASc score is 4 (HTN, aortic plaque, age greater than 75-2) ? ?On Eliquis with no bleeding issues.  Dose was reduced because of renal insufficiency.  With follow-up labs being slightly better with improved function, we may be able to adjust back to 5 mg twice daily. ? ?Check BMP-based on renal function, may adjust back to 5 mg twice daily Eliquis.  (Based on last labs, should probably be okay.  Will discuss with our pharmacist team). ?

## 2021-03-13 NOTE — Assessment & Plan Note (Signed)
Permanent rate controlled A-fib.  Heart rate range is below her baseline regardless of having reduced his Toprol dose down to 50 mg daily.  Minimal exercise intolerance now that he has become more active. ? ?Certainly cannot increase beta-blocker up any further now.  No real active signs of cardiac incompetence. ? ?Plan: ?? Continue current dose of Toprol 50 mg daily for now => although anticipate we may need to back down on this. ?? PRN flecainide per protocol for breakthrough atrial fibrillation. ?? Continue anticoagulation with Eliquis, however renal function seems improved.  We may need to increase back up to 5 mg twice daily.  We will need to reassess BMP, based on those results, potentially increase back to 5 mg twice daily Eliquis. ?

## 2021-03-14 LAB — BASIC METABOLIC PANEL
BUN/Creatinine Ratio: 14 (ref 10–24)
BUN: 22 mg/dL (ref 8–27)
CO2: 20 mmol/L (ref 20–29)
Calcium: 9.9 mg/dL (ref 8.6–10.2)
Chloride: 106 mmol/L (ref 96–106)
Creatinine, Ser: 1.62 mg/dL — ABNORMAL HIGH (ref 0.76–1.27)
Glucose: 111 mg/dL — ABNORMAL HIGH (ref 70–99)
Potassium: 5.3 mmol/L — ABNORMAL HIGH (ref 3.5–5.2)
Sodium: 142 mmol/L (ref 134–144)
eGFR: 42 mL/min/{1.73_m2} — ABNORMAL LOW (ref >59)

## 2021-03-17 ENCOUNTER — Telehealth: Payer: Self-pay | Admitting: Cardiology

## 2021-03-17 NOTE — Telephone Encounter (Signed)
Patient is returning call to discuss lab results. 

## 2021-03-17 NOTE — Telephone Encounter (Signed)
Contacted patient, advised of lab work ?Patient verbalized understanding.  ? ?

## 2021-04-16 ENCOUNTER — Other Ambulatory Visit: Payer: Self-pay | Admitting: Cardiology

## 2021-07-25 ENCOUNTER — Other Ambulatory Visit: Payer: Self-pay | Admitting: Cardiology

## 2021-07-26 NOTE — Telephone Encounter (Signed)
Prescription refill request for Eliquis received.  Indication: afib  Last office visit: Ellyn Hack, 03/13/2021 Scr: 1.62, 03/13/2021 Age: 81 yo  Weight:  94.3 kg   Refill sent.

## 2021-09-08 ENCOUNTER — Telehealth: Payer: Self-pay

## 2021-09-08 NOTE — Patient Instructions (Signed)
Visit Information  Thank you for taking time to visit with me today. Please don't hesitate to contact me if I can be of assistance to you.   Following are the goals we discussed today:   Goals Addressed             This Visit's Progress    COMPLETED: Care Coordination Activities-No follow up required       Care Coordination Interventions: Advised patient to Annual wellness visit and flu vaccine.  Patient has ad annual wellness visit.           Please call the care guide team at [phone] if you need to cancel or reschedule your appointment.   If you are experiencing a Mental Health or [place] or need someone to talk to, please call the Suicide and Crisis Lifeline: 988   Patient verbalizes understanding of instructions and care plan provided today and agrees to view in [place]. Active MyChart status and patient understanding of how to access instructions and care plan via MyChart confirmed with patient.      Jone Baseman, RN, MSN [facility] Care Management Care Management Coordinator Direct Line [phone] Toll Free: [phone]  Fax: [phone]

## 2021-09-08 NOTE — Patient Outreach (Signed)
  Care Coordination   Initial Visit Note   09/08/2021 Name: ELEANOR DIMICHELE MRN: [id number] DOB: [birth year]  SEVILLE DOWNS is a 81 y.o. year old male who sees Deland Pretty, MD for primary care. I spoke with  [name] by phone today.  What matters to the patients health and wellness today?  None     Goals Addressed             This Visit's Progress    COMPLETED: Care Coordination Activities-No follow up required       Care Coordination Interventions: Advised patient to Annual wellness visit and flu vaccine.  Patient has ad annual wellness visit.          SDOH assessments and interventions completed:  No      Care Coordination Interventions Activated:  Yes  Care Coordination Interventions:  Yes, provided   Follow up plan: No further intervention required.   Encounter Outcome:  Pt. Visit Completed   Jone Baseman, RN, MSN [facility] Care Management Coordinator Direct Line [phone] Toll Free: [phone]  Fax: [phone]

## 2022-01-18 ENCOUNTER — Ambulatory Visit: Payer: Medicare Other | Admitting: Cardiology

## 2022-01-19 NOTE — Progress Notes (Signed)
Cardiology Office Note:    Date:  01/22/2022   ID:  Joseph Landry, DOB [birth year], MRN [id number]  PCP:  Deland Pretty, [facility] [facility] Cardiologist:  Glenetta Hew, MD     Referring MD: Deland Pretty, MD   Chief Complaint  Patient presents with   annual visit    Seen for Dr. Ellyn Hack    History of Present Illness:    Joseph Landry is a 82 y.o. male with a hx of permanent atrial fib (CHADSVASC 3 on Eliquis reduced dose), hypertension, venous stasis, CKD stage III, dyslipidemia.  Initially referred to Dr. Ellyn Hack for atrial fib in 2012.  Was initially tried on Multaq however he was intolerant of this.  He was last seen in our office on 03/13/2021 by Dr. Ellyn Hack, at that time he was doing well from a cardiac perspective.  His metoprolol was changed from twice a day to just at bedtime due to low HR.  He was working out most days of the week and feeling relatively well.   He presents today for follow up of his AF. He has been doing well since his last visit. He continues to work out four days/week. He checks his blood pressure daily and presents a BP log, they range 108-133/64-78. He saw his nephrologist in December and his PCP back in June. No changes were made to his medications or plan of care. He denies chest pain, palpitations, dyspnea, pnd, orthopnea, n, v, dizziness, syncope, edema, weight gain, or early satiety. Denies hematochezia, hemoptysis, and hematuria. No recent falls.    Past Medical History:  Diagnosis Date   Arthritis    Dyslipidemia     on  simvastatin    Essential hypertension    Labile; often elevated in clinic visits, but never at home   Kidney disease    Permanent atrial fibrillation ([place])    Rate controlled with beta blocker; anticoagulation with Eliquis   Ulcer of ankle ([place])    right ankle; s/p I&D   Varicose veins    s/p R GSFA Endovenous Ablation   Wears glasses    Wears hearing aid    both ears   Wears partial dentures      Past Surgical History:  Procedure Laterality Date   APPLICATION OF A-CELL OF EXTREMITY Right 12/31/2013   Procedure: PLACEMENT OF ACELL RIGHT MEDIAL ANKLE;  Surgeon: Theodoro Kos, DO;  Location: [facility];  Service: Plastics;  Laterality: Right;   COLONOSCOPY     ENDOVENOUS ABLATION SAPHENOUS VEIN W/ LASER Right 01-14-2014   EVLA right greater saphenous vein by Curt Jews MD   event monitor  08/22/2011-09/20/2011   shows a fib- startedon FLECAINDE   I & D EXTREMITY Right 12/31/2013   Procedure: IRRIGATION AND DEBRIDEMENT RIGHT ANKLE WOUND;  Surgeon: Theodoro Kos, DO;  Location: [facility];  Service: Plastics;  Laterality: Right;   NASAL SINUS SURGERY     NM MYOVIEW LTD - Persantine  August 2012   No infarct or ischemia.   TRANSTHORACIC ECHOCARDIOGRAM  August 2012   Normal LV size and function, normal LA size, EF> 55%.    Current Medications: Current Meds  Medication Sig   amLODipine (NORVASC) 5 MG tablet TAKE 1 TABLET(5 MG) BY MOUTH DAILY   apixaban (ELIQUIS) 2.5 MG TABS tablet TAKE 1 TABLET(2.5 MG) BY MOUTH TWICE DAILY   flecainide (TAMBOCOR) 50 MG tablet Take 2 tablets on the onset of fast heartrate , continue 50  mg twice a day until heart rate is back to normal as needed   folic acid (FOLVITE) 419 MCG tablet Take 400 mcg by mouth daily.    metoprolol succinate (TOPROL-XL) 100 MG 24 hr tablet Take 50 mg by mouth at bedtime.   Omega-3 Fatty Acids (FISH OIL) 1000 MG CAPS Take by mouth.   rosuvastatin (CRESTOR) 20 MG tablet Take 20 mg by mouth daily.   Vitamin D, Cholecalciferol, 50 MCG (2000 UT) CAPS Take 1 capsule by mouth daily.     Allergies:   Multaq [dronedarone]   Social History   Socioeconomic History   Marital status: Married    Spouse name: Not on file   Number of children: Not on file   Years of education: Not on file   Highest education level: Not on file  Occupational History   Not on file  Tobacco Use   Smoking status:  Never   Smokeless tobacco: Never  Vaping Use   Vaping Use: Never used  Substance and Sexual Activity   Alcohol use: Yes    Alcohol/week: 0.0 standard drinks of alcohol    Comment: occ   Drug use: No   Sexual activity: Not on file  Other Topics Concern   Not on file  Social History Narrative   He is a married father of 2, grandfather of 1.     An occasional social alcoholic drink but does not smoke.       He and his wife recently moved into apartment last winter.  This caused him to get out of his exercise routine, and he is not yet gotten back into it.      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   He now enjoys taking trips back and forth to the mountains and beach spending time with grandkids.  Just not quite as active walking as he used to.  He notes that he has put on some weight since retiring-eating out more .      In the fall 2022, he began to go back to the gym: About 1 mile a treadmill and 45 minutes on the machines with arm and leg exercises.   Social Determinants of Health   Financial Resource Strain: Not on file  Food Insecurity: Not on file  Transportation Needs: Not on file  Physical Activity: Not on file  Stress: Not on file  Social Connections: Not on file     Family History: The patient's family history includes Heart disease in his mother; Stroke (age of onset: 11) in his mother.  ROS:   Please see the history of present illness.    All other systems reviewed and are negative.  EKGs/Labs/Other Studies Reviewed:    The following studies were reviewed today:    EKG:  EKG is ordered today.  The ekg ordered today demonstrates AF, HR 50 bpm, consistent with previous EKG tracings.   Recent Labs: 03/13/2021: BUN 22; Creatinine, Ser 1.62; Potassium 5.3; Sodium 142  Recent Lipid Panel No results found for: "CHOL", "TRIG", "HDL", "CHOLHDL", "VLDL", "LDLCALC", "LDLDIRECT"   Risk Assessment/Calculations:    CHA2DS2-VASc Score  = 3   This indicates a 3.2% annual risk of stroke. The patient's score is based upon: CHF History: 0 HTN History: 1 Diabetes History: 0 Stroke History: 0 Vascular Disease History: 0 Age Score: 2 Gender Score: 0     HYPERTENSION CONTROL Vitals:   01/22/22 0752 01/22/22 0841  BP: (!) 166/68 Marland Kitchen)  148/82    The patient's blood pressure is elevated above target today.  In order to address the patient's elevated BP: Blood pressure will be monitored at home to determine if medication changes need to be made.; The blood pressure is usually elevated in clinic.  Blood pressures monitored at home have been optimal.            Physical Exam:    VS:  BP (!) 148/82   Pulse (!) 50   Ht 6' (1.829 m)   Wt 201 lb 9.6 oz (91.4 kg)   BMI 27.34 kg/m     Wt Readings from Last 3 Encounters:  01/22/22 201 lb 9.6 oz (91.4 kg)  03/13/21 207 lb 12.8 oz (94.3 kg)  01/18/21 212 lb (96.2 kg)     GEN:  Well nourished, well developed in no acute distress HEENT: Normal NECK: No JVD; No carotid bruits LYMPHATICS: No lymphadenopathy CARDIAC: irregular rhythm, no murmurs, rubs, gallops RESPIRATORY:  Clear to auscultation without rales, wheezing or rhonchi  ABDOMEN: Soft, non-tender, non-distended MUSCULOSKELETAL:  No edema; No deformity  SKIN: Warm and dry NEUROLOGIC:  Alert and oriented x 3 PSYCHIATRIC:  Normal affect   ASSESSMENT:    1. Permanent atrial fibrillation (HCC) CHA2DS2Vasc Score 4, On Eliquis   2. Hypercoagulable state due to longstanding persistent atrial fibrillation ([place])   3. Essential hypertension   4. Stage 3 chronic kidney disease, unspecified whether stage 3a or 3b CKD (HCC)    PLAN:    In order of problems listed above:  Permanent atrial fib - CHA2DS2-VASc 3, Continue Eliquis (dose reduction secondary to previously elevated Scr), rate controlled. Toprol dose was decreased at last visit, HR today 50 and he is tolerating well, continue current dose of toprol. He has  not used his Flecainide in over a year.  HTN - BP today 166/68, rechecked 148/82. BP log reviewed from home readings, 108-133/64-78, BP is well controlled at home, suspect element of white coat HTN. Continue with current antihypertensive regimen. Labs were checked in December by his nephrologist, unavailable for review, will request labs from their office.  HLD - managed by his PCP, reports no changes were made to his current regimen.  CKD - follow with Nephrology saw them in December, will request recent labwork. Continue to avoid nephrotoxic agents.            Medication Adjustments/Labs and Tests Ordered: Current medicines are reviewed at length with the patient today.  Concerns regarding medicines are outlined above.  No orders of the defined types were placed in this encounter.  No orders of the defined types were placed in this encounter.   Patient Instructions  Medication Instructions:  The current medical regimen is effective;  continue present plan and medications as directed. Please refer to the Current Medication list given to you today.  *If you need a refill on your cardiac medications before your next appointment, please call your pharmacy*  Lab Work: NONE If you have labs (blood work) drawn today and your tests are completely normal, you will receive your results only by: [place] (if you have MyChart) OR A paper copy in the mail If you have any lab test that is abnormal or we need to change your treatment, we will call you to review the results.  Testing/Procedures: NONE   Follow-Up: At [facility], you and your health needs are our priority.  As part of our continuing mission to provide you with exceptional heart care, we  have created designated Provider Care Teams.  These Care Teams include your primary Cardiologist (physician) and Advanced Practice Providers (APPs -  Physician Assistants and Nurse Practitioners) who all work together to provide you  with the care you need, when you need it.  Your next appointment:   12 month(s)  Provider:   Glenetta Hew, MD     Other Instructions NONE    Signed, Trudi Ida, NP  01/22/2022 9:23 AM    [place]

## 2022-01-22 ENCOUNTER — Encounter: Payer: Self-pay | Admitting: General Practice

## 2022-01-22 ENCOUNTER — Ambulatory Visit: Payer: Medicare Other | Attending: Cardiology | Admitting: Cardiology

## 2022-01-22 VITALS — BP 148/82 | HR 50 | Ht 72.0 in | Wt 201.6 lb

## 2022-01-22 DIAGNOSIS — I10 Essential (primary) hypertension: Secondary | ICD-10-CM

## 2022-01-22 DIAGNOSIS — D6869 Other thrombophilia: Secondary | ICD-10-CM

## 2022-01-22 DIAGNOSIS — I4821 Permanent atrial fibrillation: Secondary | ICD-10-CM

## 2022-01-22 DIAGNOSIS — I4811 Longstanding persistent atrial fibrillation: Secondary | ICD-10-CM | POA: Diagnosis present

## 2022-01-22 DIAGNOSIS — N183 Chronic kidney disease, stage 3 unspecified: Secondary | ICD-10-CM

## 2022-01-22 NOTE — Addendum Note (Signed)
Addended by: Waylan Rocher on: 01/22/2022 02:16 PM   Modules accepted: Orders

## 2022-01-22 NOTE — Patient Instructions (Signed)
Medication Instructions:  The current medical regimen is effective;  continue present plan and medications as directed. Please refer to the Current Medication list given to you today.  *If you need a refill on your cardiac medications before your next appointment, please call your pharmacy*  Lab Work: NONE If you have labs (blood work) drawn today and your tests are completely normal, you will receive your results only by: [place] (if you have MyChart) OR A paper copy in the mail If you have any lab test that is abnormal or we need to change your treatment, we will call you to review the results.  Testing/Procedures: NONE   Follow-Up: At [facility], you and your health needs are our priority.  As part of our continuing mission to provide you with exceptional heart care, we have created designated Provider Care Teams.  These Care Teams include your primary Cardiologist (physician) and Advanced Practice Providers (APPs -  Physician Assistants and Nurse Practitioners) who all work together to provide you with the care you need, when you need it.  Your next appointment:   12 month(s)  Provider:   Glenetta Hew, MD     Other Instructions NONE

## 2022-02-01 ENCOUNTER — Other Ambulatory Visit: Payer: Self-pay

## 2022-02-01 DIAGNOSIS — I4821 Permanent atrial fibrillation: Secondary | ICD-10-CM

## 2022-02-01 MED ORDER — APIXABAN 2.5 MG PO TABS: ORAL_TABLET | ORAL | 1 refills | Status: DC

## 2022-02-01 NOTE — Telephone Encounter (Signed)
Prescription refill request for Eliquis received. Indication: Afib  Last office visit: 01/22/22 Gretta Cool)  Scr: 1.62 (03/13/21)  Age: 82 Weight: 91.4kg  Appropriate dose. Refill sent.

## 2022-04-16 ENCOUNTER — Other Ambulatory Visit: Payer: Self-pay | Admitting: Cardiology

## 2022-07-12 ENCOUNTER — Encounter: Payer: Self-pay | Admitting: Physical Therapy

## 2022-07-12 ENCOUNTER — Ambulatory Visit: Payer: Medicare Other | Attending: Internal Medicine | Admitting: Physical Therapy

## 2022-07-12 DIAGNOSIS — R262 Difficulty in walking, not elsewhere classified: Secondary | ICD-10-CM | POA: Insufficient documentation

## 2022-07-12 DIAGNOSIS — M6281 Muscle weakness (generalized): Secondary | ICD-10-CM | POA: Diagnosis present

## 2022-07-12 NOTE — Therapy (Signed)
OUTPATIENT PHYSICAL THERAPY LOWER EXTREMITY EVALUATION   Patient Name: Joseph Landry MRN: [id number] DOB:1940-07-27, 82 y.o., male Today's Date: 07/12/2022  END OF SESSION:  PT End of Session - 07/12/22 1305     Visit Number 1    Date for PT Re-Evaluation 10/12/22    Authorization Type [facility]    PT Start Time 1304    PT Stop Time 1352    PT Time Calculation (min) 48 min    Activity Tolerance Patient tolerated treatment well    Behavior During Therapy WFL for tasks assessed/performed             Past Medical History:  Diagnosis Date   Arthritis    Dyslipidemia     on  simvastatin    Essential hypertension    Labile; often elevated in clinic visits, but never at home   Kidney disease    Permanent atrial fibrillation (HCC)    Rate controlled with beta blocker; anticoagulation with Eliquis   Ulcer of ankle (HCC)    right ankle; s/p I&D   Varicose veins    s/p R GSFA Endovenous Ablation   Wears glasses    Wears hearing aid    both ears   Wears partial dentures    Past Surgical History:  Procedure Laterality Date   APPLICATION OF A-CELL OF EXTREMITY Right 12/31/2013   Procedure: PLACEMENT OF ACELL RIGHT MEDIAL ANKLE;  Surgeon: Wayland Denis, DO;  Location: [facility];  Service: Plastics;  Laterality: Right;   COLONOSCOPY     ENDOVENOUS ABLATION SAPHENOUS VEIN W/ LASER Right 01-14-2014   EVLA right greater saphenous vein by Gretta Began MD   event monitor  08/22/2011-09/20/2011   shows a fib- startedon FLECAINDE   I & D EXTREMITY Right 12/31/2013   Procedure: IRRIGATION AND DEBRIDEMENT RIGHT ANKLE WOUND;  Surgeon: Wayland Denis, DO;  Location: [facility];  Service: Plastics;  Laterality: Right;   NASAL SINUS SURGERY     NM MYOVIEW LTD - Persantine  August 2012   No infarct or ischemia.   TRANSTHORACIC ECHOCARDIOGRAM  August 2012   Normal LV size and function, normal LA size, EF> 55%.   Patient Active Problem List   Diagnosis Date Noted    Hypercoagulable state due to atrial fibrillation (HCC) 02/22/2021   CKD (chronic kidney disease), stage III (HCC) 11/13/2019   Venous stasis dermatitis of both lower extremities 05/15/2019   H/O hematuria 08/18/2013   H/O: GI bleed 08/18/2013   Permanent atrial fibrillation (HCC) CHA2DS2Vasc Score 4, On Eliquis    Dyslipidemia, goal LDL below 100    Essential hypertension     PCP: Merri Brunette, MD  REFERRING PROVIDER: Renne Crigler, MD  REFERRING DIAG: malaise, fatigue, difficulty walking  THERAPY DIAG:  Muscle weakness (generalized)  Difficulty in walking, not elsewhere classified  Rationale for Evaluation and Treatment: Rehabilitation  ONSET DATE: 07/09/22  SUBJECTIVE:   SUBJECTIVE STATEMENT: Reports that he has had Covid multiple times, recently went to [place] in May and got covid when he came back he got covid and feels like he got better but then the fatigue set in, reports that he is fatigued and really having trouble being active, unable to go to the gym and unable to be out and do much in the yard  PERTINENT HISTORY: See above PAIN:  Are you having pain? No  PRECAUTIONS: Fall  RED FLAGS: None   WEIGHT BEARING RESTRICTIONS: No  FALLS:  Has patient fallen in last 6  months? Yes. Number of falls 1  LIVING ENVIRONMENT: Lives with: lives with their family and lives with their spouse Lives in: House/apartment Stairs: No Has following equipment at home: None  OCCUPATION: retired Paediatric nurse  PLOF: Independent and gym 4x/week, doing his own yard work  was walking a mile a few times a week  PATIENT GOALS: be stronger, have better endurance, do more without having to rest, do my yardwork  NEXT MD VISIT: none scheduled until October  OBJECTIVE:   DIAGNOSTIC FINDINGS: none  COGNITION: Overall cognitive status: Within functional limits for tasks assessed     SENSATION: WFL  POSTURE: rounded shoulders, forward head, and decreased lumbar lordosis  PALPATION: Good  mm tone  LOWER EXTREMITY ROM:  Active ROM Right eval Left eval  Hip flexion    Hip extension    Hip abduction    Hip adduction    Hip internal rotation    Hip external rotation    Knee flexion    Knee extension    Ankle dorsiflexion    Ankle plantarflexion    Ankle inversion    Ankle eversion     (Blank rows = not tested)  LOWER EXTREMITY MMT:  MMT Right eval Left eval  Hip flexion 4- 4-  Hip extension 4- 4-  Hip abduction 4- 4-  Hip adduction    Hip internal rotation    Hip external rotation    Knee flexion 4 4  Knee extension 4 4  Ankle dorsiflexion 4 4  Ankle plantarflexion 3 3  Ankle inversion    Ankle eversion     (Blank rows = not tested) UE strength: 4/5 FUNCTIONAL TESTS:  5 times sit to stand: 16 seconds used hands on legs Timed up and go (TUG): 13 seconds 3 minute walk test: 660 feet RPE 7  GAIT: Distance walked: 660 feet Assistive device utilized: None Level of assistance: Complete Independence Comments: good speed reports that he is always looking for a place to sit down   TODAY'S TREATMENT:                                                                                                                              DATE:  07/12/22 Nustep level 5 x 5 minutes    PATIENT EDUCATION:  Education details: POC Person educated: Patient and Spouse Education method: Explanation Education comprehension: verbalized understanding  HOME EXERCISE PROGRAM: TBD  ASSESSMENT:  CLINICAL IMPRESSION: Patient is a 82 y.o. male who was seen today for physical therapy evaluation and treatment for fatigue and weakness.  Had Covid multiple times last time in May he reports that he recovered but after a few weeks he became very fatigued and lethargic and has not been able to get going again.  Had a high RPE for 3 minute walk test.  Does reports some balance difficulties, is unable to water garden and has not been able to shop or weed eat recently due to  fatigue  OBJECTIVE IMPAIRMENTS: Abnormal gait, cardiopulmonary status limiting activity, decreased activity tolerance, decreased balance, decreased coordination, decreased endurance, decreased mobility, difficulty walking, decreased ROM, decreased strength, impaired flexibility, postural dysfunction, and pain.   REHAB POTENTIAL: Good  CLINICAL DECISION MAKING: Stable/uncomplicated  EVALUATION COMPLEXITY: Low   GOALS: Goals reviewed with patient? Yes  SHORT TERM GOALS: Target date: 07/26/22 Independent with initial HEP Goal status: INITIAL  LONG TERM GOALS: Target date: 10/12/22  Independent with advanced HEP or gym program Goal status: INITIAL  2.  Increase hip strength to 4+/5 Goal status: INITIAL  3.  Decrease TUG time to 10 seconds Goal status: INITIAL  4.  Decrease 5X STS time to 12 seconds Goal status: INITIAL  5.  Increase ability to walk to >1500 feet without RPE >4 Goal status: INITIAL  PLAN:  PT FREQUENCY: 1-2x/week  PT DURATION: 12 weeks  PLANNED INTERVENTIONS: Therapeutic exercises, Therapeutic activity, Neuromuscular re-education, Balance training, Gait training, Patient/Family education, Self Care, Joint mobilization, and Manual therapy  PLAN FOR NEXT SESSION: work on strength, balance, endurance and overall functional abilities   Isadora Delorey W, PT 07/12/2022, 1:06 PM

## 2022-07-18 ENCOUNTER — Encounter: Payer: Self-pay | Admitting: Physical Therapy

## 2022-07-18 ENCOUNTER — Ambulatory Visit: Payer: Medicare Other | Admitting: Physical Therapy

## 2022-07-18 DIAGNOSIS — M6281 Muscle weakness (generalized): Secondary | ICD-10-CM

## 2022-07-18 DIAGNOSIS — R262 Difficulty in walking, not elsewhere classified: Secondary | ICD-10-CM

## 2022-07-18 NOTE — Therapy (Signed)
OUTPATIENT PHYSICAL THERAPY LOWER EXTREMITY EVALUATION   Patient Name: Joseph Landry MRN: [id number] DOB:[birth year], 82 y.o., male Today's Date: 07/18/2022  END OF SESSION:    Past Medical History:  Diagnosis Date   Arthritis    Dyslipidemia     on  simvastatin    Essential hypertension    Labile; often elevated in clinic visits, but never at home   Kidney disease    Permanent atrial fibrillation (HCC)    Rate controlled with beta blocker; anticoagulation with Eliquis   Ulcer of ankle (HCC)    right ankle; s/p I&D   Varicose veins    s/p R GSFA Endovenous Ablation   Wears glasses    Wears hearing aid    both ears   Wears partial dentures    Past Surgical History:  Procedure Laterality Date   APPLICATION OF A-CELL OF EXTREMITY Right 12/31/2013   Procedure: PLACEMENT OF ACELL RIGHT MEDIAL ANKLE;  Surgeon: Wayland Denis, DO;  Location: [facility];  Service: Plastics;  Laterality: Right;   COLONOSCOPY     ENDOVENOUS ABLATION SAPHENOUS VEIN W/ LASER Right 01-14-2014   EVLA right greater saphenous vein by Gretta Began MD   event monitor  08/22/2011-09/20/2011   shows a fib- startedon FLECAINDE   I & D EXTREMITY Right 12/31/2013   Procedure: IRRIGATION AND DEBRIDEMENT RIGHT ANKLE WOUND;  Surgeon: Wayland Denis, DO;  Location: [facility];  Service: Plastics;  Laterality: Right;   NASAL SINUS SURGERY     NM MYOVIEW LTD - Persantine  August 2012   No infarct or ischemia.   TRANSTHORACIC ECHOCARDIOGRAM  August 2012   Normal LV size and function, normal LA size, EF> 55%.   Patient Active Problem List   Diagnosis Date Noted   Hypercoagulable state due to atrial fibrillation (HCC) 02/22/2021   CKD (chronic kidney disease), stage III (HCC) 11/13/2019   Venous stasis dermatitis of both lower extremities 05/15/2019   H/O hematuria 08/18/2013   H/O: GI bleed 08/18/2013   Permanent atrial fibrillation (HCC) CHA2DS2Vasc Score 4, On Eliquis     Dyslipidemia, goal LDL below 100    Essential hypertension     PCP: Merri Brunette, MD  REFERRING PROVIDER: Renne Crigler, MD  REFERRING DIAG: malaise, fatigue, difficulty walking  THERAPY DIAG:  No diagnosis found.  Rationale for Evaluation and Treatment: Rehabilitation  ONSET DATE: 07/09/22  SUBJECTIVE:   SUBJECTIVE STATEMENT: Reports that he has had Covid multiple times, recently went to [place] in May and got covid when he came back he got covid and feels like he got better but then the fatigue set in, reports that he is fatigued and really having trouble being active, unable to go to the gym and unable to be out and do much in the yard  PERTINENT HISTORY: See above PAIN:  Are you having pain? No  PRECAUTIONS: Fall  RED FLAGS: None   WEIGHT BEARING RESTRICTIONS: No  FALLS:  Has patient fallen in last 6 months? Yes. Number of falls 1  LIVING ENVIRONMENT: Lives with: lives with their family and lives with their spouse Lives in: House/apartment Stairs: No Has following equipment at home: None  OCCUPATION: retired Paediatric nurse  PLOF: Independent and gym 4x/week, doing his own yard work  was walking a mile a few times a week  PATIENT GOALS: be stronger, have better endurance, do more without having to rest, do my yardwork  NEXT MD VISIT: none scheduled until October  OBJECTIVE:   DIAGNOSTIC FINDINGS:  none  COGNITION: Overall cognitive status: Within functional limits for tasks assessed     SENSATION: WFL  POSTURE: rounded shoulders, forward head, and decreased lumbar lordosis  PALPATION: Good mm tone  LOWER EXTREMITY ROM:  Active ROM Right eval Left eval  Hip flexion    Hip extension    Hip abduction    Hip adduction    Hip internal rotation    Hip external rotation    Knee flexion    Knee extension    Ankle dorsiflexion    Ankle plantarflexion    Ankle inversion    Ankle eversion     (Blank rows = not tested)  LOWER EXTREMITY MMT:  MMT Right eval  Left eval  Hip flexion 4- 4-  Hip extension 4- 4-  Hip abduction 4- 4-  Hip adduction    Hip internal rotation    Hip external rotation    Knee flexion 4 4  Knee extension 4 4  Ankle dorsiflexion 4 4  Ankle plantarflexion 3 3  Ankle inversion    Ankle eversion     (Blank rows = not tested) UE strength: 4/5 FUNCTIONAL TESTS:  5 times sit to stand: 16 seconds used hands on legs Timed up and go (TUG): 13 seconds 3 minute walk test: 660 feet RPE 7  GAIT: Distance walked: 660 feet Assistive device utilized: None Level of assistance: Complete Independence Comments: good speed reports that he is always looking for a place to sit down   TODAY'S TREATMENT:                                                                                                                              DATE:  07/18/22 NuStep L5 x 6 min Supine with legs over physioball, bridge, bridge with hip IR, x 10 each, bridge with ball roll 2 x 5, ball roll KTC 2 x 10  Heel raises on 4" step, 2 x 10 reps Step ups onto 4" step, forward, side step, crossover. Airex- started with weight shifts, progressed to marching with increasing knee height, VC to slow down Leg press, 2 x 10 for legs and also 2 x 10 ankle PF Side stepping on Airex beam x 5 in each direction. challenging  07/12/22 Nustep level 5 x 5 minutes    PATIENT EDUCATION:  Education details: POC Person educated: Patient and Spouse Education method: Explanation Education comprehension: verbalized understanding  HOME EXERCISE PROGRAM: TBD  ASSESSMENT:  CLINICAL IMPRESSION: Patient reports no new issues. He tolerated an increase in weight and activity today, HR and SATS remained WNL. He requires frequent cues to slow down.  OBJECTIVE IMPAIRMENTS: Abnormal gait, cardiopulmonary status limiting activity, decreased activity tolerance, decreased balance, decreased coordination, decreased endurance, decreased mobility, difficulty walking, decreased ROM,  decreased strength, impaired flexibility, postural dysfunction, and pain.   REHAB POTENTIAL: Good  CLINICAL DECISION MAKING: Stable/uncomplicated  EVALUATION COMPLEXITY: Low   GOALS: Goals reviewed with patient? Yes  SHORT  TERM GOALS: Target date: 07/26/22 Independent with initial HEP Goal status: INITIAL  LONG TERM GOALS: Target date: 10/12/22  Independent with advanced HEP or gym program Goal status: INITIAL  2.  Increase hip strength to 4+/5 Goal status: INITIAL  3.  Decrease TUG time to 10 seconds Goal status: INITIAL  4.  Decrease 5X STS time to 12 seconds Goal status: INITIAL  5.  Increase ability to walk to >1500 feet without RPE >4 Goal status: INITIAL  PLAN:  PT FREQUENCY: 1-2x/week  PT DURATION: 12 weeks  PLANNED INTERVENTIONS: Therapeutic exercises, Therapeutic activity, Neuromuscular re-education, Balance training, Gait training, Patient/Family education, Self Care, Joint mobilization, and Manual therapy  PLAN FOR NEXT SESSION: work on strength, balance, endurance and overall functional abilities  Iona Beard, DPT 07/18/2022, 5:56 PM

## 2022-07-22 ENCOUNTER — Other Ambulatory Visit: Payer: Self-pay | Admitting: Cardiology

## 2022-07-22 DIAGNOSIS — I4821 Permanent atrial fibrillation: Secondary | ICD-10-CM

## 2022-07-23 NOTE — Telephone Encounter (Signed)
Prescription refill request for Eliquis received. Indication:AFIB Last office visit:1/24 Scr:1.3  6/24 Age: 82 Weight:91.4  kg  Prescription refilled

## 2022-07-24 ENCOUNTER — Encounter: Payer: Self-pay | Admitting: Physical Therapy

## 2022-07-24 ENCOUNTER — Ambulatory Visit: Payer: Medicare Other | Admitting: Physical Therapy

## 2022-07-24 DIAGNOSIS — R262 Difficulty in walking, not elsewhere classified: Secondary | ICD-10-CM

## 2022-07-24 DIAGNOSIS — M6281 Muscle weakness (generalized): Secondary | ICD-10-CM

## 2022-07-24 NOTE — Therapy (Signed)
OUTPATIENT PHYSICAL THERAPY LOWER EXTREMITY EVALUATION   Patient Name: Joseph Landry MRN: [id number] DOB:10-11-1940, 82 y.o., male Today's Date: 07/24/2022  END OF SESSION:  PT End of Session - 07/24/22 1013     Visit Number 3    Date for PT Re-Evaluation 10/12/22    PT Start Time 1015    PT Stop Time 1100    PT Time Calculation (min) 45 min    Activity Tolerance Patient tolerated treatment well    Behavior During Therapy WFL for tasks assessed/performed              Past Medical History:  Diagnosis Date   Arthritis    Dyslipidemia     on  simvastatin    Essential hypertension    Labile; often elevated in clinic visits, but never at home   Kidney disease    Permanent atrial fibrillation (HCC)    Rate controlled with beta blocker; anticoagulation with Eliquis   Ulcer of ankle (HCC)    right ankle; s/p I&D   Varicose veins    s/p R GSFA Endovenous Ablation   Wears glasses    Wears hearing aid    both ears   Wears partial dentures    Past Surgical History:  Procedure Laterality Date   APPLICATION OF A-CELL OF EXTREMITY Right 12/31/2013   Procedure: PLACEMENT OF ACELL RIGHT MEDIAL ANKLE;  Surgeon: Wayland Denis, DO;  Location: [facility];  Service: Plastics;  Laterality: Right;   COLONOSCOPY     ENDOVENOUS ABLATION SAPHENOUS VEIN W/ LASER Right 01-14-2014   EVLA right greater saphenous vein by Gretta Began MD   event monitor  08/22/2011-09/20/2011   shows a fib- startedon FLECAINDE   I & D EXTREMITY Right 12/31/2013   Procedure: IRRIGATION AND DEBRIDEMENT RIGHT ANKLE WOUND;  Surgeon: Wayland Denis, DO;  Location: [facility];  Service: Plastics;  Laterality: Right;   NASAL SINUS SURGERY     NM MYOVIEW L[facility] - [place]  August 2012   No infarct or ischemia.   TRANSTHORACIC ECHOCARDIOGRAM  August 2012   Normal LV size and function, normal LA size, EF> 55%.   Patient Active Problem List   Diagnosis Date Noted   Hypercoagulable state  due to atrial fibrillation (HCC) 02/22/2021   CKD (chronic kidney disease), stage III (HCC) 11/13/2019   Venous stasis dermatitis of both lower extremities 05/15/2019   H/O hematuria 08/18/2013   H/O: GI bleed 08/18/2013   Permanent atrial fibrillation (HCC) CHA2DS2Vasc Score 4, On Eliquis    Dyslipidemia, goal LDL below 100    Essential hypertension     PCP: Merri Brunette, MD  REFERRING PROVIDER: Renne Crigler, MD  REFERRING DIAG: malaise, fatigue, difficulty walking  THERAPY DIAG:  Difficulty in walking, not elsewhere classified  Muscle weakness (generalized)  Rationale for Evaluation and Treatment: Rehabilitation  ONSET DATE: 07/09/22  SUBJECTIVE:   SUBJECTIVE STATEMENT: Diagnosed with long covid, got weak. Today he is feeling good. Activity fatigue has been the biggest thing.   PERTINENT HISTORY: See above PAIN:  Are you having pain? No  PRECAUTIONS: Fall  RED FLAGS: None   WEIGHT BEARING RESTRICTIONS: No  FALLS:  Has patient fallen in last 6 months? Yes. Number of falls 1  LIVING ENVIRONMENT: Lives with: lives with their family and lives with their spouse Lives in: House/apartment Stairs: No Has following equipment at home: None  OCCUPATION: retired Paediatric nurse  PLOF: Independent and gym 4x/week, doing his own yard work  was walking a  mile a few times a week  PATIENT GOALS: be stronger, have better endurance, do more without having to rest, do my yardwork  NEXT MD VISIT: none scheduled until October  OBJECTIVE:   DIAGNOSTIC FINDINGS: none  COGNITION: Overall cognitive status: Within functional limits for tasks assessed     SENSATION: WFL  POSTURE: rounded shoulders, forward head, and decreased lumbar lordosis  PALPATION: Good mm tone  LOWER EXTREMITY ROM:  Active ROM Right eval Left eval  Hip flexion    Hip extension    Hip abduction    Hip adduction    Hip internal rotation    Hip external rotation    Knee flexion    Knee extension    Ankle  dorsiflexion    Ankle plantarflexion    Ankle inversion    Ankle eversion     (Blank rows = not tested)  LOWER EXTREMITY MMT:  MMT Right eval Left eval  Hip flexion 4- 4-  Hip extension 4- 4-  Hip abduction 4- 4-  Hip adduction    Hip internal rotation    Hip external rotation    Knee flexion 4 4  Knee extension 4 4  Ankle dorsiflexion 4 4  Ankle plantarflexion 3 3  Ankle inversion    Ankle eversion     (Blank rows = not tested) UE strength: 4/5 FUNCTIONAL TESTS:  5 times sit to stand: 16 seconds used hands on legs Timed up and go (TUG): 13 seconds 3 minute walk test: 660 feet RPE 7  GAIT: Distance walked: 660 feet Assistive device utilized: None Level of assistance: Complete Independence Comments: good speed reports that he is always looking for a place to sit down   TODAY'S TREATMENT:                                                                                                                              DATE:  07/24/22 UBE L2 x 2 min each way Bike L 4 x 4 min S2S OHP yellow ball 2x10 Shoulder Ext 5lb 2x10 Seated HS curls 25lb 2x12 Leg Ext 10lb 2x12 Step ups 6in x 10 each Lateral step ups 6in x10 each Heel raised 2x10 black bar  07/18/22 NuStep L5 x 6 min Supine with legs over physioball, bridge, bridge with hip IR, x 10 each, bridge with ball roll 2 x 5, ball roll KTC 2 x 10  Heel raises on 4" step, 2 x 10 reps Step ups onto 4" step, forward, side step, crossover. Airex- started with weight shifts, progressed to marching with increasing knee height, VC to slow down Leg press, 2 x 10 for legs and also 2 x 10 ankle PF Side stepping on Airex beam x 5 in each direction. challenging  07/12/22 Nustep level 5 x 5 minutes    PATIENT EDUCATION:  Education details: POC Person educated: Patient and Spouse Education method: Explanation Education comprehension: verbalized understanding  HOME EXERCISE PROGRAM: TBD  ASSESSMENT:  CLINICAL IMPRESSION: Pt  enters doing well. HR and SATS remained WNL despite a progressed session. Cue needed to slow down when doing some reps. Signs of fatigue present with sit to stand and step ups. Postural cu required with shoulder extensions. Pt reports endurance as his limiting factor.   OBJECTIVE IMPAIRMENTS: Abnormal gait, cardiopulmonary status limiting activity, decreased activity tolerance, decreased balance, decreased coordination, decreased endurance, decreased mobility, difficulty walking, decreased ROM, decreased strength, impaired flexibility, postural dysfunction, and pain.   REHAB POTENTIAL: Good  CLINICAL DECISION MAKING: Stable/uncomplicated  EVALUATION COMPLEXITY: Low   GOALS: Goals reviewed with patient? Yes  SHORT TERM GOALS: Target date: 07/26/22 Independent with initial HEP Goal status: INITIAL  LONG TERM GOALS: Target date: 10/12/22  Independent with advanced HEP or gym program Goal status: INITIAL  2.  Increase hip strength to 4+/5 Goal status: INITIAL  3.  Decrease TUG time to 10 seconds Goal status: INITIAL  4.  Decrease 5X STS time to 12 seconds Goal status: INITIAL  5.  Increase ability to walk to >1500 feet without RPE >4 Goal status: INITIAL  PLAN:  PT FREQUENCY: 1-2x/week  PT DURATION: 12 weeks  PLANNED INTERVENTIONS: Therapeutic exercises, Therapeutic activity, Neuromuscular re-education, Balance training, Gait training, Patient/Family education, Self Care, Joint mobilization, and Manual therapy  PLAN FOR NEXT SESSION: work on strength, balance, endurance and overall functional abilities  Iona Beard, DPT 07/24/2022, 10:13 AM

## 2022-07-25 ENCOUNTER — Other Ambulatory Visit (HOSPITAL_COMMUNITY): Payer: Self-pay

## 2022-07-26 ENCOUNTER — Ambulatory Visit: Payer: Medicare Other | Admitting: Physical Therapy

## 2022-07-26 ENCOUNTER — Encounter: Payer: Self-pay | Admitting: Physical Therapy

## 2022-07-26 DIAGNOSIS — R262 Difficulty in walking, not elsewhere classified: Secondary | ICD-10-CM

## 2022-07-26 DIAGNOSIS — M6281 Muscle weakness (generalized): Secondary | ICD-10-CM

## 2022-07-26 NOTE — Therapy (Signed)
OUTPATIENT PHYSICAL THERAPY LOWER EXTREMITY EVALUATION   Patient Name: Joseph Landry MRN: [id number] DOB:January 24, 1940, 82 y.o., male Today's Date: 07/26/2022  END OF SESSION:  PT End of Session - 07/26/22 1017     Visit Number 4    Date for PT Re-Evaluation 10/12/22    PT Start Time 1017    PT Stop Time 1100    PT Time Calculation (min) 43 min    Activity Tolerance Patient tolerated treatment well    Behavior During Therapy WFL for tasks assessed/performed              Past Medical History:  Diagnosis Date   Arthritis    Dyslipidemia     on  simvastatin    Essential hypertension    Labile; often elevated in clinic visits, but never at home   Kidney disease    Permanent atrial fibrillation (HCC)    Rate controlled with beta blocker; anticoagulation with Eliquis   Ulcer of ankle (HCC)    right ankle; s/p I&D   Varicose veins    s/p R GSFA Endovenous Ablation   Wears glasses    Wears hearing aid    both ears   Wears partial dentures    Past Surgical History:  Procedure Laterality Date   APPLICATION OF A-CELL OF EXTREMITY Right 12/31/2013   Procedure: PLACEMENT OF ACELL RIGHT MEDIAL ANKLE;  Surgeon: Wayland Denis, DO;  Location: [facility];  Service: Plastics;  Laterality: Right;   COLONOSCOPY     ENDOVENOUS ABLATION SAPHENOUS VEIN W/ LASER Right 01-14-2014   EVLA right greater saphenous vein by Gretta Began MD   event monitor  08/22/2011-09/20/2011   shows a fib- startedon FLECAINDE   I & D EXTREMITY Right 12/31/2013   Procedure: IRRIGATION AND DEBRIDEMENT RIGHT ANKLE WOUND;  Surgeon: Wayland Denis, DO;  Location: [facility];  Service: Plastics;  Laterality: Right;   NASAL SINUS SURGERY     NM MYOVIEW LTD - [place]  August 2012   No infarct or ischemia.   TRANSTHORACIC ECHOCARDIOGRAM  August 2012   Normal LV size and function, normal LA size, EF> 55%.   Patient Active Problem List   Diagnosis Date Noted   Hypercoagulable state  due to atrial fibrillation (HCC) 02/22/2021   CKD (chronic kidney disease), stage III (HCC) 11/13/2019   Venous stasis dermatitis of both lower extremities 05/15/2019   H/O hematuria 08/18/2013   H/O: GI bleed 08/18/2013   Permanent atrial fibrillation (HCC) CHA2DS2Vasc Score 4, On Eliquis    Dyslipidemia, goal LDL below 100    Essential hypertension     PCP: Merri Brunette, MD  REFERRING PROVIDER: Renne Crigler, MD  REFERRING DIAG: malaise, fatigue, difficulty walking  THERAPY DIAG:  Muscle weakness (generalized)  Difficulty in walking, not elsewhere classified  Rationale for Evaluation and Treatment: Rehabilitation  ONSET DATE: 07/09/22  SUBJECTIVE:   SUBJECTIVE STATEMENT: Some soreness in the calves   PERTINENT HISTORY: See above PAIN:  Are you having pain? No  PRECAUTIONS: Fall  RED FLAGS: None   WEIGHT BEARING RESTRICTIONS: No  FALLS:  Has patient fallen in last 6 months? Yes. Number of falls 1  LIVING ENVIRONMENT: Lives with: lives with their family and lives with their spouse Lives in: House/apartment Stairs: No Has following equipment at home: None  OCCUPATION: retired Paediatric nurse  PLOF: Independent and gym 4x/week, doing his own yard work  was walking a mile a few times a week  PATIENT GOALS: be stronger, have better  endurance, do more without having to rest, do my yardwork  NEXT MD VISIT: none scheduled until October  OBJECTIVE:   DIAGNOSTIC FINDINGS: none  COGNITION: Overall cognitive status: Within functional limits for tasks assessed     SENSATION: WFL  POSTURE: rounded shoulders, forward head, and decreased lumbar lordosis  PALPATION: Good mm tone  LOWER EXTREMITY ROM:  Active ROM Right eval Left eval  Hip flexion    Hip extension    Hip abduction    Hip adduction    Hip internal rotation    Hip external rotation    Knee flexion    Knee extension    Ankle dorsiflexion    Ankle plantarflexion    Ankle inversion    Ankle eversion      (Blank rows = not tested)  LOWER EXTREMITY MMT:  MMT Right eval Left eval  Hip flexion 4- 4-  Hip extension 4- 4-  Hip abduction 4- 4-  Hip adduction    Hip internal rotation    Hip external rotation    Knee flexion 4 4  Knee extension 4 4  Ankle dorsiflexion 4 4  Ankle plantarflexion 3 3  Ankle inversion    Ankle eversion     (Blank rows = not tested) UE strength: 4/5 FUNCTIONAL TESTS:  5 times sit to stand: 16 seconds used hands on legs Timed up and go (TUG): 13 seconds 3 minute walk test: 660 feet RPE 7  GAIT: Distance walked: 660 feet Assistive device utilized: None Level of assistance: Complete Independence Comments: good speed reports that he is always looking for a place to sit down   TODAY'S TREATMENT:                                                                                                                              DATE:  07/26/22 Bike 3.5 x 6 min HS curls 35lb 2x10 Leg Ext 10lb 2x10 S2S OHP yellow ball 2x10 Seated rows & Lats 35lb 2x10 Leg press 50lb 2x20 Calf stretch 3x10''  07/24/22 UBE L2 x 2 min each way Bike L 4 x 4 min S2S OHP yellow ball 2x10 Shoulder Ext 5lb 2x10 Seated HS curls 25lb 2x12 Leg Ext 10lb 2x12 Step ups 6in x 10 each Lateral step ups 6in x10 each Heel raised 2x10 black bar  07/18/22 NuStep L5 x 6 min Supine with legs over physioball, bridge, bridge with hip IR, x 10 each, bridge with ball roll 2 x 5, ball roll KTC 2 x 10  Heel raises on 4" step, 2 x 10 reps Step ups onto 4" step, forward, side step, crossover. Airex- started with weight shifts, progressed to marching with increasing knee height, VC to slow down Leg press, 2 x 10 for legs and also 2 x 10 ankle PF Side stepping on Airex beam x 5 in each direction. challenging  07/12/22 Nustep level 5 x 5 minutes    PATIENT EDUCATION:  Education  details: POC Person educated: Patient and Spouse Education method: Explanation Education comprehension: verbalized  understanding  HOME EXERCISE PROGRAM: Access Code: [email or web address] URL: [email or web address] Date: 07/26/2022 Prepared by: Debroah Baller  Exercises - Sit to Stand Without Arm Support  - 1 x daily - 7 x weekly - 3 sets - 10 reps - Standing March  - 1 x daily - 7 x weekly - 3 sets - 10 reps - Supine Bridge  - 1 x daily - 7 x weekly - 3 sets - 10 reps  ASSESSMENT:  CLINICAL IMPRESSION: Pt enters doing well. HR and SATS remained WNL. Continues to require cue to slow down when doing reps. Signs of fatigue present with sit to stand and leg press. Cues needed for core engagement with seated rows and lats. Pt reports endurance as his limiting factor. No pain during session  OBJECTIVE IMPAIRMENTS: Abnormal gait, cardiopulmonary status limiting activity, decreased activity tolerance, decreased balance, decreased coordination, decreased endurance, decreased mobility, difficulty walking, decreased ROM, decreased strength, impaired flexibility, postural dysfunction, and pain.   REHAB POTENTIAL: Good  CLINICAL DECISION MAKING: Stable/uncomplicated  EVALUATION COMPLEXITY: Low   GOALS: Goals reviewed with patient? Yes  SHORT TERM GOALS: Target date: 07/26/22 Independent with initial HEP Goal status: INITIAL  LONG TERM GOALS: Target date: 10/12/22  Independent with advanced HEP or gym program Goal status: INITIAL  2.  Increase hip strength to 4+/5 Goal status: INITIAL  3.  Decrease TUG time to 10 seconds Goal status: INITIAL  4.  Decrease 5X STS time to 12 seconds Goal status: INITIAL  5.  Increase ability to walk to >1500 feet without RPE >4 Goal status: INITIAL  PLAN:  PT FREQUENCY: 1-2x/week  PT DURATION: 12 weeks  PLANNED INTERVENTIONS: Therapeutic exercises, Therapeutic activity, Neuromuscular re-education, Balance training, Gait training, Patient/Family education, Self Care, Joint mobilization, and Manual therapy  PLAN FOR NEXT SESSION: work on strength,  balance, endurance and overall functional abilities  Iona Beard, DPT 07/26/2022, 10:18 AM

## 2022-07-31 ENCOUNTER — Encounter: Payer: Self-pay | Admitting: Physical Therapy

## 2022-07-31 ENCOUNTER — Ambulatory Visit: Payer: Medicare Other | Admitting: Physical Therapy

## 2022-07-31 DIAGNOSIS — M6281 Muscle weakness (generalized): Secondary | ICD-10-CM | POA: Diagnosis not present

## 2022-07-31 NOTE — Therapy (Signed)
OUTPATIENT PHYSICAL THERAPY LOWER EXTREMITY EVALUATION   Patient Name: Joseph Landry MRN: [id number] DOB:[birth year], 82 y.o., male Today's Date: 07/31/2022  END OF SESSION:  PT End of Session - 07/31/22 1016     Visit Number 5    Date for PT Re-Evaluation 10/12/22    PT Start Time 1015    PT Stop Time 1100    PT Time Calculation (min) 45 min    Activity Tolerance Patient tolerated treatment well    Behavior During Therapy WFL for tasks assessed/performed              Past Medical History:  Diagnosis Date   Arthritis    Dyslipidemia     on  simvastatin    Essential hypertension    Labile; often elevated in clinic visits, but never at home   Kidney disease    Permanent atrial fibrillation (HCC)    Rate controlled with beta blocker; anticoagulation with Eliquis   Ulcer of ankle (HCC)    right ankle; s/p I&D   Varicose veins    s/p R GSFA Endovenous Ablation   Wears glasses    Wears hearing aid    both ears   Wears partial dentures    Past Surgical History:  Procedure Laterality Date   APPLICATION OF A-CELL OF EXTREMITY Right 12/31/2013   Procedure: PLACEMENT OF ACELL RIGHT MEDIAL ANKLE;  Surgeon: Wayland Denis, DO;  Location: [facility];  Service: Plastics;  Laterality: Right;   COLONOSCOPY     ENDOVENOUS ABLATION SAPHENOUS VEIN W/ LASER Right 01-14-2014   EVLA right greater saphenous vein by Gretta Began MD   event monitor  08/22/2011-09/20/2011   shows a fib- startedon FLECAINDE   I & D EXTREMITY Right 12/31/2013   Procedure: IRRIGATION AND DEBRIDEMENT RIGHT ANKLE WOUND;  Surgeon: Wayland Denis, DO;  Location: [facility];  Service: Plastics;  Laterality: Right;   NASAL SINUS SURGERY     NM MYOVIEW LTD - Persantine  August 2012   No infarct or ischemia.   TRANSTHORACIC ECHOCARDIOGRAM  August 2012   Normal LV size and function, normal LA size, EF> 55%.   Patient Active Problem List   Diagnosis Date Noted   Hypercoagulable state  due to atrial fibrillation (HCC) 02/22/2021   CKD (chronic kidney disease), stage III (HCC) 11/13/2019   Venous stasis dermatitis of both lower extremities 05/15/2019   H/O hematuria 08/18/2013   H/O: GI bleed 08/18/2013   Permanent atrial fibrillation (HCC) CHA2DS2Vasc Score 4, On Eliquis    Dyslipidemia, goal LDL below 100    Essential hypertension     PCP: Merri Brunette, MD  REFERRING PROVIDER: Renne Crigler, MD  REFERRING DIAG: malaise, fatigue, difficulty walking  THERAPY DIAG:  Muscle weakness (generalized)  Rationale for Evaluation and Treatment: Rehabilitation  ONSET DATE: 07/09/22  SUBJECTIVE:   SUBJECTIVE STATEMENT: Had a good weekend,Feeling pretty good  PERTINENT HISTORY: See above PAIN:  Are you having pain? No  PRECAUTIONS: Fall  RED FLAGS: None   WEIGHT BEARING RESTRICTIONS: No  FALLS:  Has patient fallen in last 6 months? Yes. Number of falls 1  LIVING ENVIRONMENT: Lives with: lives with their family and lives with their spouse Lives in: House/apartment Stairs: No Has following equipment at home: None  OCCUPATION: retired Paediatric nurse  PLOF: Independent and gym 4x/week, doing his own yard work  was walking a mile a few times a week  PATIENT GOALS: be stronger, have better endurance, do more without having to rest,  do my yardwork  NEXT MD VISIT: none scheduled until October  OBJECTIVE:   DIAGNOSTIC FINDINGS: none  COGNITION: Overall cognitive status: Within functional limits for tasks assessed     SENSATION: WFL  POSTURE: rounded shoulders, forward head, and decreased lumbar lordosis  PALPATION: Good mm tone  LOWER EXTREMITY ROM:  Active ROM Right eval Left eval  Hip flexion    Hip extension    Hip abduction    Hip adduction    Hip internal rotation    Hip external rotation    Knee flexion    Knee extension    Ankle dorsiflexion    Ankle plantarflexion    Ankle inversion    Ankle eversion     (Blank rows = not tested)  LOWER  EXTREMITY MMT:  MMT Right eval Left eval  Hip flexion 4- 4-  Hip extension 4- 4-  Hip abduction 4- 4-  Hip adduction    Hip internal rotation    Hip external rotation    Knee flexion 4 4  Knee extension 4 4  Ankle dorsiflexion 4 4  Ankle plantarflexion 3 3  Ankle inversion    Ankle eversion     (Blank rows = not tested) UE strength: 4/5 FUNCTIONAL TESTS:  5 times sit to stand: 16 seconds used hands on legs Timed up and go (TUG): 13 seconds 3 minute walk test: 660 feet RPE 7  GAIT: Distance walked: 660 feet Assistive device utilized: None Level of assistance: Complete Independence Comments: good speed reports that he is always looking for a place to sit down   TODAY'S TREATMENT:                                                                                                                              DATE:  07/31/22 Bike L4 x 6 min Checked Goals  Gait around back building around wildest part of parking lot RPE 3 Leg press 60lb 2x15  S2S OHP blue ball 2x10 HS curls 35lb 2x12 Leg Ext 10lb 2x12   07/26/22 Bike 3.5 x 6 min HS curls 35lb 2x10 Leg Ext 10lb 2x10 S2S OHP yellow ball 2x10 Seated rows & Lats 35lb 2x10 Leg press 50lb 2x20 Calf stretch 3x10''  07/24/22 UBE L2 x 2 min each way Bike L 4 x 4 min S2S OHP yellow ball 2x10 Shoulder Ext 5lb 2x10 Seated HS curls 25lb 2x12 Leg Ext 10lb 2x12 Step ups 6in x 10 each Lateral step ups 6in x10 each Heel raised 2x10 black bar  07/18/22 NuStep L5 x 6 min Supine with legs over physioball, bridge, bridge with hip IR, x 10 each, bridge with ball roll 2 x 5, ball roll KTC 2 x 10  Heel raises on 4" step, 2 x 10 reps Step ups onto 4" step, forward, side step, crossover. Airex- started with weight shifts, progressed to marching with increasing knee height, VC to slow down Leg press, 2 x 10 for  legs and also 2 x 10 ankle PF Side stepping on Airex beam x 5 in each direction. challenging  07/12/22 Nustep level 5 x 5  minutes    PATIENT EDUCATION:  Education details: POC Person educated: Patient and Spouse Education method: Explanation Education comprehension: verbalized understanding  HOME EXERCISE PROGRAM: Access Code: [email or web address] URL: [email or web address] Date: 07/26/2022 Prepared by: Debroah Baller  Exercises - Sit to Stand Without Arm Support  - 1 x daily - 7 x weekly - 3 sets - 10 reps - Standing March  - 1 x daily - 7 x weekly - 3 sets - 10 reps - Supine Bridge  - 1 x daily - 7 x weekly - 3 sets - 10 reps  ASSESSMENT:  CLINICAL IMPRESSION: Pt enters doing well, and has progressed meeting some long and short terms goals. Continues to require cue to slow down when doing reps. Signs of fatigue present with sit to stands OHP. Cues needed for core engagement with standing shoulder Ext.  Pt reports endurance as his limiting factor. No pain during session  OBJECTIVE IMPAIRMENTS: Abnormal gait, cardiopulmonary status limiting activity, decreased activity tolerance, decreased balance, decreased coordination, decreased endurance, decreased mobility, difficulty walking, decreased ROM, decreased strength, impaired flexibility, postural dysfunction, and pain.   REHAB POTENTIAL: Good  CLINICAL DECISION MAKING: Stable/uncomplicated  EVALUATION COMPLEXITY: Low   GOALS: Goals reviewed with patient? Yes  SHORT TERM GOALS: Target date: 07/26/22 Independent with initial HEP Goal status: Met 07/31/22  LONG TERM GOALS: Target date: 10/12/22  Independent with advanced HEP or gym program Goal status: INITIAL  2.  Increase hip strength to 4+/5 Goal status: INITIAL  3.  Decrease TUG time to 10 seconds Goal status: Met 8.78 07/31/22  4.  Decrease 5X STS time to 12 seconds Goal status: met  07/31/22 10.54  5.  Increase ability to walk to >1500 feet without RPE >4 Goal status: met 07/31/22  PLAN:  PT FREQUENCY: 1-2x/week  PT DURATION: 12 weeks  PLANNED INTERVENTIONS: Therapeutic  exercises, Therapeutic activity, Neuromuscular re-education, Balance training, Gait training, Patient/Family education, Self Care, Joint mobilization, and Manual therapy  PLAN FOR NEXT SESSION: work on strength, balance, endurance and overall functional abilities  Iona Beard, DPT 07/31/2022, 10:16 AM

## 2022-08-02 ENCOUNTER — Ambulatory Visit: Payer: Medicare Other | Attending: Internal Medicine | Admitting: Physical Therapy

## 2022-08-02 ENCOUNTER — Encounter: Payer: Self-pay | Admitting: Physical Therapy

## 2022-08-02 DIAGNOSIS — R262 Difficulty in walking, not elsewhere classified: Secondary | ICD-10-CM | POA: Diagnosis present

## 2022-08-02 DIAGNOSIS — M6281 Muscle weakness (generalized): Secondary | ICD-10-CM | POA: Diagnosis present

## 2022-08-02 NOTE — Therapy (Signed)
OUTPATIENT PHYSICAL THERAPY LOWER EXTREMITY EVALUATION   Patient Name: Joseph Landry MRN: [id number] DOB:29-Apr-1940, 82 y.o., male Today's Date: 08/02/2022  END OF SESSION:  PT End of Session - 08/02/22 1019     Visit Number 6    Date for PT Re-Evaluation 10/12/22    PT Start Time 1015    PT Stop Time 1100    PT Time Calculation (min) 45 min    Activity Tolerance Patient tolerated treatment well    Behavior During Therapy WFL for tasks assessed/performed              Past Medical History:  Diagnosis Date   Arthritis    Dyslipidemia     on  simvastatin    Essential hypertension    Labile; often elevated in clinic visits, but never at home   Kidney disease    Permanent atrial fibrillation (HCC)    Rate controlled with beta blocker; anticoagulation with Eliquis   Ulcer of ankle (HCC)    right ankle; s/p I&D   Varicose veins    s/p R GSFA Endovenous Ablation   Wears glasses    Wears hearing aid    both ears   Wears partial dentures    Past Surgical History:  Procedure Laterality Date   APPLICATION OF A-CELL OF EXTREMITY Right 12/31/2013   Procedure: PLACEMENT OF ACELL RIGHT MEDIAL ANKLE;  Surgeon: Wayland Denis, DO;  Location: [facility];  Service: Plastics;  Laterality: Right;   COLONOSCOPY     ENDOVENOUS ABLATION SAPHENOUS VEIN W/ LASER Right 01-14-2014   EVLA right greater saphenous vein by Gretta Began MD   event monitor  08/22/2011-09/20/2011   shows a fib- startedon FLECAINDE   I & D EXTREMITY Right 12/31/2013   Procedure: IRRIGATION AND DEBRIDEMENT RIGHT ANKLE WOUND;  Surgeon: Wayland Denis, DO;  Location: [facility];  Service: Plastics;  Laterality: Right;   NASAL SINUS SURGERY     NM MYOVIEW LTD - Persantine  August 2012   No infarct or ischemia.   TRANSTHORACIC ECHOCARDIOGRAM  August 2012   Normal LV size and function, normal LA size, EF> 55%.   Patient Active Problem List   Diagnosis Date Noted   Hypercoagulable state due  to atrial fibrillation (HCC) 02/22/2021   CKD (chronic kidney disease), stage III (HCC) 11/13/2019   Venous stasis dermatitis of both lower extremities 05/15/2019   H/O hematuria 08/18/2013   H/O: GI bleed 08/18/2013   Permanent atrial fibrillation (HCC) CHA2DS2Vasc Score 4, On Eliquis    Dyslipidemia, goal LDL below 100    Essential hypertension     PCP: Merri Brunette, MD  REFERRING PROVIDER: Renne Crigler, MD  REFERRING DIAG: malaise, fatigue, difficulty walking  THERAPY DIAG:  Muscle weakness (generalized)  Difficulty in walking, not elsewhere classified  Rationale for Evaluation and Treatment: Rehabilitation  ONSET DATE: 07/09/22  SUBJECTIVE:   SUBJECTIVE STATEMENT: Doing pretty goog  PERTINENT HISTORY: See above PAIN:  Are you having pain? No  PRECAUTIONS: Fall  RED FLAGS: None   WEIGHT BEARING RESTRICTIONS: No  FALLS:  Has patient fallen in last 6 months? Yes. Number of falls 1  LIVING ENVIRONMENT: Lives with: lives with their family and lives with their spouse Lives in: House/apartment Stairs: No Has following equipment at home: None  OCCUPATION: retired Paediatric nurse  PLOF: Independent and gym 4x/week, doing his own yard work  was walking a mile a few times a week  PATIENT GOALS: be stronger, have better endurance, do more  without having to rest, do my yardwork  NEXT MD VISIT: none scheduled until October  OBJECTIVE:   DIAGNOSTIC FINDINGS: none  COGNITION: Overall cognitive status: Within functional limits for tasks assessed     SENSATION: WFL  POSTURE: rounded shoulders, forward head, and decreased lumbar lordosis  PALPATION: Good mm tone  LOWER EXTREMITY ROM:  Active ROM Right eval Left eval  Hip flexion    Hip extension    Hip abduction    Hip adduction    Hip internal rotation    Hip external rotation    Knee flexion    Knee extension    Ankle dorsiflexion    Ankle plantarflexion    Ankle inversion    Ankle eversion     (Blank rows  = not tested)  LOWER EXTREMITY MMT:  MMT Right eval Left eval  Hip flexion 4- 4-  Hip extension 4- 4-  Hip abduction 4- 4-  Hip adduction    Hip internal rotation    Hip external rotation    Knee flexion 4 4  Knee extension 4 4  Ankle dorsiflexion 4 4  Ankle plantarflexion 3 3  Ankle inversion    Ankle eversion     (Blank rows = not tested) UE strength: 4/5 FUNCTIONAL TESTS:  5 times sit to stand: 16 seconds used hands on legs Timed up and go (TUG): 13 seconds 3 minute walk test: 660 feet RPE 7  GAIT: Distance walked: 660 feet Assistive device utilized: None Level of assistance: Complete Independence Comments: good speed reports that he is always looking for a place to sit down   TODAY'S TREATMENT:                                                                                                                              DATE:  08/02/22 NuStep L5 x 7 min HS curls 35lb 2x12 Leg Ext 10lb 2x12 S2S OHP blue ball 2x12  Seated Rows & Lats 35lb 2x10 Chest press 10lb 2x10 Triceps Ext 20lb 2x15 6in box on airex step ups x10 each 07/31/22 Bike L4 x 6 min Checked Goals  Gait around back building around wildest part of parking lot RPE 3 Leg press 60lb 2x15  S2S OHP blue ball 2x10 HS curls 35lb 2x12 Leg Ext 10lb 2x12   07/26/22 Bike 3.5 x 6 min HS curls 35lb 2x10 Leg Ext 10lb 2x10 S2S OHP yellow ball 2x10 Seated rows & Lats 35lb 2x10 Leg press 50lb 2x20 Calf stretch 3x10''  07/24/22 UBE L2 x 2 min each way Bike L 4 x 4 min S2S OHP yellow ball 2x10 Shoulder Ext 5lb 2x10 Seated HS curls 25lb 2x12 Leg Ext 10lb 2x12 Step ups 6in x 10 each Lateral step ups 6in x10 each Heel raised 2x10 black bar  07/18/22 NuStep L5 x 6 min Supine with legs over physioball, bridge, bridge with hip IR, x 10 each, bridge with ball roll 2 x 5, ball  roll KTC 2 x 10  Heel raises on 4" step, 2 x 10 reps Step ups onto 4" step, forward, side step, crossover. Airex- started with weight  shifts, progressed to marching with increasing knee height, VC to slow down Leg press, 2 x 10 for legs and also 2 x 10 ankle PF Side stepping on Airex beam x 5 in each direction. challenging  07/12/22 Nustep level 5 x 5 minutes    PATIENT EDUCATION:  Education details: POC Person educated: Patient and Spouse Education method: Explanation Education comprehension: verbalized understanding  HOME EXERCISE PROGRAM: Access Code: [email or web address] URL: [email or web address] Date: 07/26/2022 Prepared by: Debroah Baller  Exercises - Sit to Stand Without Arm Support  - 1 x daily - 7 x weekly - 3 sets - 10 reps - Standing March  - 1 x daily - 7 x weekly - 3 sets - 10 reps - Supine Bridge  - 1 x daily - 7 x weekly - 3 sets - 10 reps  ASSESSMENT:  CLINICAL IMPRESSION: Pt enters doing well. Continues to require cue to slow down when doing reps. Signs of fatigue present with sit to stands OHP and with step ups. Cues needed for core engagement with seated rows. Cue for full ROM needed with leg curls and extensions. Pt reports endurance as his limiting factor. No pain during session  OBJECTIVE IMPAIRMENTS: Abnormal gait, cardiopulmonary status limiting activity, decreased activity tolerance, decreased balance, decreased coordination, decreased endurance, decreased mobility, difficulty walking, decreased ROM, decreased strength, impaired flexibility, postural dysfunction, and pain.   REHAB POTENTIAL: Good  CLINICAL DECISION MAKING: Stable/uncomplicated  EVALUATION COMPLEXITY: Low   GOALS: Goals reviewed with patient? Yes  SHORT TERM GOALS: Target date: 07/26/22 Independent with initial HEP Goal status: Met 07/31/22  LONG TERM GOALS: Target date: 10/12/22  Independent with advanced HEP or gym program Goal status: INITIAL  2.  Increase hip strength to 4+/5 Goal status: INITIAL  3.  Decrease TUG time to 10 seconds Goal status: Met 8.78 07/31/22  4.  Decrease 5X STS time to 12  seconds Goal status: met  07/31/22 10.54  5.  Increase ability to walk to >1500 feet without RPE >4 Goal status: met 07/31/22  PLAN:  PT FREQUENCY: 1-2x/week  PT DURATION: 12 weeks  PLANNED INTERVENTIONS: Therapeutic exercises, Therapeutic activity, Neuromuscular re-education, Balance training, Gait training, Patient/Family education, Self Care, Joint mobilization, and Manual therapy  PLAN FOR NEXT SESSION: work on strength, balance, endurance and overall functional abilities  Debroah Baller, PTA 08/02/2022, 10:19 AM

## 2022-08-07 ENCOUNTER — Ambulatory Visit: Payer: Medicare Other | Admitting: Physical Therapy

## 2022-08-07 ENCOUNTER — Encounter: Payer: Self-pay | Admitting: Physical Therapy

## 2022-08-07 DIAGNOSIS — M6281 Muscle weakness (generalized): Secondary | ICD-10-CM | POA: Diagnosis not present

## 2022-08-07 DIAGNOSIS — R262 Difficulty in walking, not elsewhere classified: Secondary | ICD-10-CM

## 2022-08-07 NOTE — Therapy (Signed)
OUTPATIENT PHYSICAL THERAPY LOWER EXTREMITY TREATMENT   Patient Name: Joseph Landry MRN: [id number] DOB:22-Oct-1940, 82 y.o., male Today's Date: 08/07/2022  END OF SESSION:  PT End of Session - 08/07/22 1019     Visit Number 7    Date for PT Re-Evaluation 10/12/22    Authorization Type [employer]    PT Start Time 1015    PT Stop Time 1100    PT Time Calculation (min) 45 min    Activity Tolerance Patient tolerated treatment well    Behavior During Therapy WFL for tasks assessed/performed              Past Medical History:  Diagnosis Date   Arthritis    Dyslipidemia     on  simvastatin    Essential hypertension    Labile; often elevated in clinic visits, but never at home   Kidney disease    Permanent atrial fibrillation (HCC)    Rate controlled with beta blocker; anticoagulation with Eliquis   Ulcer of ankle (HCC)    right ankle; s/p I&D   Varicose veins    s/p R GSFA Endovenous Ablation   Wears glasses    Wears hearing aid    both ears   Wears partial dentures    Past Surgical History:  Procedure Laterality Date   APPLICATION OF A-CELL OF EXTREMITY Right 12/31/2013   Procedure: PLACEMENT OF ACELL RIGHT MEDIAL ANKLE;  Surgeon: Wayland Denis, DO;  Location: [facility];  Service: Plastics;  Laterality: Right;   COLONOSCOPY     ENDOVENOUS ABLATION SAPHENOUS VEIN W/ LASER Right 01-14-2014   EVLA right greater saphenous vein by Gretta Began MD   event monitor  08/22/2011-09/20/2011   shows a fib- startedon FLECAINDE   I & D EXTREMITY Right 12/31/2013   Procedure: IRRIGATION AND DEBRIDEMENT RIGHT ANKLE WOUND;  Surgeon: Wayland Denis, DO;  Location:  SUR[facility]ERY C[facility];  Service: Plastics;  Laterality: Right;   NASAL SINUS SURGERY     NM MYOVIEW LTD - Persantine  August 2012   No infarct or ischemia.   TRANSTHORACIC ECHOCARDIOGRAM  August 2012   Normal LV size and function, normal LA size, EF> 55%.   Patient Active Problem List   Diagnosis Date Noted    Hypercoagulable state due to atrial fibrillation (HCC) 02/22/2021   CKD (chronic kidney disease), stage III (HCC) 11/13/2019   Venous stasis dermatitis of both lower extremities 05/15/2019   H/O hematuria 08/18/2013   H/O: GI bleed 08/18/2013   Permanent atrial fibrillation (HCC) CHA2DS2Vasc Score 4, On Eliquis    Dyslipidemia, goal LDL below 100    Essential hypertension     PCP: Merri Brunette, MD  REFERRING PROVIDER: Renne Crigler, MD  REFERRING DIAG: malaise, fatigue, difficulty walking  THERAPY DIAG:  Muscle weakness (generalized)  Difficulty in walking, not elsewhere classified  Rationale for Evaluation and Treatment: Rehabilitation  ONSET DATE: 07/09/22  SUBJECTIVE:   SUBJECTIVE STATEMENT: Patient comes in and he appears to be walking faster and his demeanor seems to be more positive, he does report feeling better, but not quite to where he wants to be or was before he got covid  PERTINENT HISTORY: See above PAIN:  Are you having pain? No  PRECAUTIONS: Fall  RED FLAGS: None   WEIGHT BEARING RESTRICTIONS: No  FALLS:  Has patient fallen in last 6 months? Yes. Number of falls 1  LIVING ENVIRONMENT: Lives with: lives with their family and lives with their spouse Lives in: House/apartment Stairs: No  Has following equipment at home: None  OCCUPATION: retired Paediatric nurse  PLOF: Independent and gym 4x/week, doing his own yard work  was walking a mile a few times a week  PATIENT GOALS: be stronger, have better endurance, do more without having to rest, do my yardwork  NEXT MD VISIT: none scheduled until October  OBJECTIVE:   DIAGNOSTIC FINDINGS: none  COGNITION: Overall cognitive status: Within functional limits for tasks assessed     SENSATION: WFL  POSTURE: rounded shoulders, forward head, and decreased lumbar lordosis  PALPATION: Good mm tone  LOWER EXTREMITY ROM:  Active ROM Right eval Left eval  Hip flexion    Hip extension    Hip abduction     Hip adduction    Hip internal rotation    Hip external rotation    Knee flexion    Knee extension    Ankle dorsiflexion    Ankle plantarflexion    Ankle inversion    Ankle eversion     (Blank rows = not tested)  LOWER EXTREMITY MMT:  MMT Right eval Left eval  Hip flexion 4- 4-  Hip extension 4- 4-  Hip abduction 4- 4-  Hip adduction    Hip internal rotation    Hip external rotation    Knee flexion 4 4  Knee extension 4 4  Ankle dorsiflexion 4 4  Ankle plantarflexion 3 3  Ankle inversion    Ankle eversion     (Blank rows = not tested) UE strength: 4/5 FUNCTIONAL TESTS:  5 times sit to stand: 16 seconds used hands on legs Timed up and go (TUG): 13 seconds 3 minute walk test: 660 feet RPE 7   08/07/22 800 feet 6 minutes   GAIT: Distance walked: 660 feet Assistive device utilized: None Level of assistance: Complete Independence Comments: good speed reports that he is always looking for a place to sit down   TODAY'S TREATMENT:                                                                                                                              DATE:  08/07/22 Nustep level 5 x 7 minutes Gait outside around the back building good pace 800 feet 6 minutes 35# HS curls 2x10 Leg extension 10# 2x12 25# triceps 10# biceps Leg press 40# 2x12 Seated row 25# Lats 25# Side stepping on and off airex  On iarex ball toss  08/02/22 NuStep L5 x 7 min HS curls 35lb 2x12 Leg Ext 10lb 2x12 S2S OHP blue ball 2x12  Seated Rows & Lats 35lb 2x10 Chest press 10lb 2x10 Triceps Ext 20lb 2x15 6in box on airex step ups x10 each 07/31/22 Bike L4 x 6 min Checked Goals  Gait around back building around wildest part of parking lot RPE 3 Leg press 60lb 2x15  S2S OHP blue ball 2x10 HS curls 35lb 2x12 Leg Ext 10lb 2x12   07/26/22 Bike 3.5 x 6 min HS curls 35lb  2x10 Leg Ext 10lb 2x10 S2S OHP yellow ball 2x10 Seated rows & Lats 35lb 2x10 Leg press 50lb 2x20 Calf stretch  3x10''  07/24/22 UBE L2 x 2 min each way Bike L 4 x 4 min S2S OHP yellow ball 2x10 Shoulder Ext 5lb 2x10 Seated HS curls 25lb 2x12 Leg Ext 10lb 2x12 Step ups 6in x 10 each Lateral step ups 6in x10 each Heel raised 2x10 black bar  07/18/22 NuStep L5 x 6 min Supine with legs over physioball, bridge, bridge with hip IR, x 10 each, bridge with ball roll 2 x 5, ball roll KTC 2 x 10  Heel raises on 4" step, 2 x 10 reps Step ups onto 4" step, forward, side step, crossover. Airex- started with weight shifts, progressed to marching with increasing knee height, VC to slow down Leg press, 2 x 10 for legs and also 2 x 10 ankle PF Side stepping on Airex beam x 5 in each direction. challenging  07/12/22 Nustep level 5 x 5 minutes    PATIENT EDUCATION:  Education details: POC Person educated: Patient and Spouse Education method: Explanation Education comprehension: verbalized understanding  HOME EXERCISE PROGRAM: Access Code: [email or web address] URL: [email or web address] Date: 07/26/2022 Prepared by: Debroah Baller  Exercises - Sit to Stand Without Arm Support  - 1 x daily - 7 x weekly - 3 sets - 10 reps - Standing March  - 1 x daily - 7 x weekly - 3 sets - 10 reps - Supine Bridge  - 1 x daily - 7 x weekly - 3 sets - 10 reps  ASSESSMENT:  CLINICAL IMPRESSION: Pt enters doing well. He is moving better, he seems to appear more positive.  He has some fatigue limitations, minor strength issues and balance difficulty.  He has a plan for what to do after PT but would like to feel more confident  OBJECTIVE IMPAIRMENTS: Abnormal gait, cardiopulmonary status limiting activity, decreased activity tolerance, decreased balance, decreased coordination, decreased endurance, decreased mobility, difficulty walking, decreased ROM, decreased strength, impaired flexibility, postural dysfunction, and pain.   REHAB POTENTIAL: Good  CLINICAL DECISION MAKING: Stable/uncomplicated  EVALUATION  COMPLEXITY: Low   GOALS: Goals reviewed with patient? Yes  SHORT TERM GOALS: Target date: 07/26/22 Independent with initial HEP Goal status: Met 07/31/22  LONG TERM GOALS: Target date: 10/12/22  Independent with advanced HEP or gym program Goal status: progressing 08/07/22  2.  Increase hip strength to 4+/5 Goal status: progressing 08/07/22  3.  Decrease TUG time to 10 seconds Goal status: Met 8.78 07/31/22  4.  Decrease 5X STS time to 12 seconds Goal status: met  07/31/22 10.54  5.  Increase ability to walk to >1500 feet without RPE >4 Goal status: met 07/31/22  PLAN:  PT FREQUENCY: 1-2x/week  PT DURATION: 12 weeks  PLANNED INTERVENTIONS: Therapeutic exercises, Therapeutic activity, Neuromuscular re-education, Balance training, Gait training, Patient/Family education, Self Care, Joint mobilization, and Manual therapy  PLAN FOR NEXT SESSION: work on strength, balance, endurance and overall functional abilities, continue to prep him for him being able to be safe and independent with gym activities in the next month to 6 weeks  Stacie Glaze, PT 08/07/2022, 10:20 AM

## 2022-08-09 ENCOUNTER — Ambulatory Visit: Payer: Medicare Other | Admitting: Physical Therapy

## 2022-08-09 ENCOUNTER — Encounter: Payer: Self-pay | Admitting: Physical Therapy

## 2022-08-09 DIAGNOSIS — M6281 Muscle weakness (generalized): Secondary | ICD-10-CM | POA: Diagnosis not present

## 2022-08-09 DIAGNOSIS — R262 Difficulty in walking, not elsewhere classified: Secondary | ICD-10-CM

## 2022-08-09 NOTE — Therapy (Signed)
OUTPATIENT PHYSICAL THERAPY LOWER EXTREMITY TREATMENT   Patient Name: Joseph Landry MRN: [id number] DOB:27-Feb-1940, 82 y.o., male Today's Date: 08/09/2022  END OF SESSION:  PT End of Session - 08/09/22 1015     Visit Number 8    Date for PT Re-Evaluation 10/12/22    Authorization Type [employer]    PT Start Time 1013    PT Stop Time 1100    PT Time Calculation (min) 47 min    Activity Tolerance Patient tolerated treatment well    Behavior During Therapy WFL for tasks assessed/performed              Past Medical History:  Diagnosis Date   Arthritis    Dyslipidemia     on  simvastatin    Essential hypertension    Labile; often elevated in clinic visits, but never at home   Kidney disease    Permanent atrial fibrillation (HCC)    Rate controlled with beta blocker; anticoagulation with Eliquis   Ulcer of ankle (HCC)    right ankle; s/p I&D   Varicose veins    s/p R GSFA Endovenous Ablation   Wears glasses    Wears hearing aid    both ears   Wears partial dentures    Past Surgical History:  Procedure Laterality Date   APPLICATION OF A-CELL OF EXTREMITY Right 12/31/2013   Procedure: PLACEMENT OF ACELL RIGHT MEDIAL ANKLE;  Surgeon: Wayland Denis, DO;  Location: [facility];  Service: Plastics;  Laterality: Right;   COLONOSCOPY     ENDOVENOUS ABLATION SAPHENOUS VEIN W/ LASER Right 01-14-2014   EVLA right greater saphenous vein by Gretta Began MD   event monitor  08/22/2011-09/20/2011   shows a fib- startedon FLECAINDE   I & D EXTREMITY Right 12/31/2013   Procedure: IRRIGATION AND DEBRIDEMENT RIGHT ANKLE WOUND;  Surgeon: Wayland Denis, DO;  Location: [facility];  Service: Plastics;  Laterality: Right;   NASAL SINUS SURGERY     NM MYOVIEW LTD - [place]antine  August 2012   No infarct or ischemia.   TRANSTHORACIC ECHOCARDIOGRAM  August 2012   Normal LV size and function, normal LA size, EF> 55%.   Patient Active Problem List   Diagnosis Date Noted    Hypercoagulable state due to atrial fibrillation (HCC) 02/22/2021   CKD (chronic kidney disease), stage III (HCC) 11/13/2019   Venous stasis dermatitis of both lower extremities 05/15/2019   H/O hematuria 08/18/2013   H/O: GI bleed 08/18/2013   Permanent atrial fibrillation (HCC) CHA2DS2Vasc Score 4, On Eliquis    Dyslipidemia, goal LDL below 100    Essential hypertension     PCP: Merri Brunette, MD  REFERRING PROVIDER: Renne Crigler, MD  REFERRING DIAG: malaise, fatigue, difficulty walking  THERAPY DIAG:  Muscle weakness (generalized)  Difficulty in walking, not elsewhere classified  Rationale for Evaluation and Treatment: Rehabilitation  ONSET DATE: 07/09/22  SUBJECTIVE:   SUBJECTIVE STATEMENT: Patient doing well reports that he went to the gym to look at the stuff again and feels like he can do some things, has some questions PERTINENT HISTORY: See above PAIN:  Are you having pain? No  PRECAUTIONS: Fall  RED FLAGS: None   WEIGHT BEARING RESTRICTIONS: No  FALLS:  Has patient fallen in last 6 months? Yes. Number of falls 1  LIVING ENVIRONMENT: Lives with: lives with their family and lives with their spouse Lives in: House/apartment Stairs: No Has following equipment at home: None  OCCUPATION: retired Paediatric nurse  PLOF:  Independent and gym 4x/week, doing his own yard work  was walking a mile a few times a week  PATIENT GOALS: be stronger, have better endurance, do more without having to rest, do my yardwork  NEXT MD VISIT: none scheduled until October  OBJECTIVE:   DIAGNOSTIC FINDINGS: none  COGNITION: Overall cognitive status: Within functional limits for tasks assessed     SENSATION: WFL  POSTURE: rounded shoulders, forward head, and decreased lumbar lordosis  PALPATION: Good mm tone  LOWER EXTREMITY ROM:  Active ROM Right eval Left eval  Hip flexion    Hip extension    Hip abduction    Hip adduction    Hip internal rotation    Hip external  rotation    Knee flexion    Knee extension    Ankle dorsiflexion    Ankle plantarflexion    Ankle inversion    Ankle eversion     (Blank rows = not tested)  LOWER EXTREMITY MMT:  MMT Right eval Left eval  Hip flexion 4- 4-  Hip extension 4- 4-  Hip abduction 4- 4-  Hip adduction    Hip internal rotation    Hip external rotation    Knee flexion 4 4  Knee extension 4 4  Ankle dorsiflexion 4 4  Ankle plantarflexion 3 3  Ankle inversion    Ankle eversion     (Blank rows = not tested) UE strength: 4/5 FUNCTIONAL TESTS:  5 times sit to stand: 16 seconds used hands on legs Timed up and go (TUG): 13 seconds 3 minute walk test: 660 feet RPE 7   08/07/22 800 feet 6 minutes   GAIT: Distance walked: 660 feet Assistive device utilized: None Level of assistance: Complete Independence Comments: good speed reports that he is always looking for a place to sit down   TODAY'S TREATMENT:                                                                                                                              DATE:  08/09/22 Nustep level 5 x 7 minutes Airex balance beam side stepping, tandem walking Upside down bosu balance and then reaching Leg curls 35# 2x12 Leg extension 10# 2x12 Biceps 10# Triceps 35# very difficult 10# straight arm pulls 50# leg press 2x10 Again talked more about the gym equipment  08/07/22 Nustep level 5 x 7 minutes Gait outside around the back building good pace 800 feet 6 minutes 35# HS curls 2x10 Leg extension 10# 2x12 25# triceps 10# biceps Leg press 40# 2x12 Seated row 25# Lats 25# Side stepping on and off airex  On iarex ball toss  08/02/22 NuStep L5 x 7 min HS curls 35lb 2x12 Leg Ext 10lb 2x12 S2S OHP blue ball 2x12  Seated Rows & Lats 35lb 2x10 Chest press 10lb 2x10 Triceps Ext 20lb 2x15 6in box on airex step ups x10 each 07/31/22 Bike L4 x 6 min Checked Goals  Gait around  back building around wildest part of parking lot RPE 3 Leg  press 60lb 2x15  S2S OHP blue ball 2x10 HS curls 35lb 2x12 Leg Ext 10lb 2x12   07/26/22 Bike 3.5 x 6 min HS curls 35lb 2x10 Leg Ext 10lb 2x10 S2S OHP yellow ball 2x10 Seated rows & Lats 35lb 2x10 Leg press 50lb 2x20 Calf stretch 3x10''  07/24/22 UBE L2 x 2 min each way Bike L 4 x 4 min S2S OHP yellow ball 2x10 Shoulder Ext 5lb 2x10 Seated HS curls 25lb 2x12 Leg Ext 10lb 2x12 Step ups 6in x 10 each Lateral step ups 6in x10 each Heel raised 2x10 black bar  07/18/22 NuStep L5 x 6 min Supine with legs over physioball, bridge, bridge with hip IR, x 10 each, bridge with ball roll 2 x 5, ball roll KTC 2 x 10  Heel raises on 4" step, 2 x 10 reps Step ups onto 4" step, forward, side step, crossover. Airex- started with weight shifts, progressed to marching with increasing knee height, VC to slow down Leg press, 2 x 10 for legs and also 2 x 10 ankle PF Side stepping on Airex beam x 5 in each direction. challenging  07/12/22 Nustep level 5 x 5 minutes    PATIENT EDUCATION:  Education details: POC Person educated: Patient and Spouse Education method: Explanation Education comprehension: verbalized understanding  HOME EXERCISE PROGRAM: Access Code: [email or web address] URL: [email or web address] Date: 07/26/2022 Prepared by: Debroah Baller  Exercises - Sit to Stand Without Arm Support  - 1 x daily - 7 x weekly - 3 sets - 10 reps - Standing March  - 1 x daily - 7 x weekly - 3 sets - 10 reps - Supine Bridge  - 1 x daily - 7 x weekly - 3 sets - 10 reps  ASSESSMENT:  CLINICAL IMPRESSION: Pt went to the gym to look around reports that he saw some machines that he thinks looked familiar but some he had questions about, we talked about him taking some pictures and bringing them in and we could look to see if we could figure it out with him on how to use correctly  OBJECTIVE IMPAIRMENTS: Abnormal gait, cardiopulmonary status limiting activity, decreased activity tolerance,  decreased balance, decreased coordination, decreased endurance, decreased mobility, difficulty walking, decreased ROM, decreased strength, impaired flexibility, postural dysfunction, and pain.   REHAB POTENTIAL: Good  CLINICAL DECISION MAKING: Stable/uncomplicated  EVALUATION COMPLEXITY: Low   GOALS: Goals reviewed with patient? Yes  SHORT TERM GOALS: Target date: 07/26/22 Independent with initial HEP Goal status: Met 07/31/22  LONG TERM GOALS: Target date: 10/12/22  Independent with advanced HEP or gym program Goal status: progressing 08/07/22  2.  Increase hip strength to 4+/5 Goal status: progressing 08/07/22  3.  Decrease TUG time to 10 seconds Goal status: Met 8.78 07/31/22  4.  Decrease 5X STS time to 12 seconds Goal status: met  07/31/22 10.54  5.  Increase ability to walk to >1500 feet without RPE >4 Goal status: met 07/31/22  PLAN:  PT FREQUENCY: 1-2x/week  PT DURATION: 12 weeks  PLANNED INTERVENTIONS: Therapeutic exercises, Therapeutic activity, Neuromuscular re-education, Balance training, Gait training, Patient/Family education, Self Care, Joint mobilization, and Manual therapy  PLAN FOR NEXT SESSION: work on strength, balance, endurance and overall functional abilities, continue to prep him for him being able to be safe and independent with gym activities in the next month to 6 weeks  Stacie Glaze, PT 08/09/2022, 10:16 AM

## 2022-08-14 ENCOUNTER — Encounter: Payer: Self-pay | Admitting: Physical Therapy

## 2022-08-14 ENCOUNTER — Ambulatory Visit: Payer: Medicare Other | Admitting: Physical Therapy

## 2022-08-14 DIAGNOSIS — R262 Difficulty in walking, not elsewhere classified: Secondary | ICD-10-CM

## 2022-08-14 DIAGNOSIS — M6281 Muscle weakness (generalized): Secondary | ICD-10-CM | POA: Diagnosis not present

## 2022-08-14 NOTE — Therapy (Signed)
OUTPATIENT PHYSICAL THERAPY LOWER EXTREMITY TREATMENT   Patient Name: Joseph Landry MRN: [id number] DOB:09/16/1940, 82 y.o., male Today's Date: 08/14/2022  END OF SESSION:  PT End of Session - 08/14/22 1424     Visit Number 9    Date for PT Re-Evaluation 10/12/22    PT Start Time 1425    PT Stop Time 1510    PT Time Calculation (min) 45 min    Activity Tolerance Patient tolerated treatment well    Behavior During Therapy WFL for tasks assessed/performed              Past Medical History:  Diagnosis Date   Arthritis    Dyslipidemia     on  simvastatin    Essential hypertension    Labile; often elevated in clinic visits, but never at home   Kidney disease    Permanent atrial fibrillation (HCC)    Rate controlled with beta blocker; anticoagulation with Eliquis   Ulcer of ankle (HCC)    right ankle; s/p I&D   Varicose veins    s/p R GSFA Endovenous Ablation   Wears glasses    Wears hearing aid    both ears   Wears partial dentures    Past Surgical History:  Procedure Laterality Date   APPLICATION OF A-CELL OF EXTREMITY Right 12/31/2013   Procedure: PLACEMENT OF ACELL RIGHT MEDIAL ANKLE;  Surgeon: Wayland Denis, DO;  Location: [facility];  Service: Plastics;  Laterality: Right;   COLONOSCOPY     ENDOVENOUS ABLATION SAPHENOUS VEIN W/ LASER Right 01-14-2014   EVLA right greater saphenous vein by Gretta Began MD   event monitor  08/22/2011-09/20/2011   shows a fib- startedon FLECAINDE   I & D EXTREMITY Right 12/31/2013   Procedure: IRRIGATION AND DEBRIDEMENT RIGHT ANKLE WOUND;  Surgeon: Wayland Denis, DO;  Location: [facility];  Service: Plastics;  Laterality: Right;   NASAL SINUS SURGERY     NM MYOVIEW LTD - Persantine  August 2012   No infarct or ischemia.   TRANSTHORACIC ECHOCARDIOGRAM  August 2012   Normal LV size and function, normal LA size, EF> 55%.   Patient Active Problem List   Diagnosis Date Noted   Hypercoagulable state due  to atrial fibrillation (HCC) 02/22/2021   CKD (chronic kidney disease), stage III (HCC) 11/13/2019   Venous stasis dermatitis of both lower extremities 05/15/2019   H/O hematuria 08/18/2013   H/O: GI bleed 08/18/2013   Permanent atrial fibrillation (HCC) CHA2DS2Vasc Score 4, On Eliquis    Dyslipidemia, goal LDL below 100    Essential hypertension     PCP: Merri Brunette, MD  REFERRING PROVIDER: Renne Crigler, MD  REFERRING DIAG: malaise, fatigue, difficulty walking  THERAPY DIAG:  Muscle weakness (generalized)  Difficulty in walking, not elsewhere classified  Rationale for Evaluation and Treatment: Rehabilitation  ONSET DATE: 07/09/22  SUBJECTIVE:   SUBJECTIVE STATEMENT: Feeling pretty good, walking good, breathing good PERTINENT HISTORY: See above PAIN:  Are you having pain? No  PRECAUTIONS: Fall  RED FLAGS: None   WEIGHT BEARING RESTRICTIONS: No  FALLS:  Has patient fallen in last 6 months? Yes. Number of falls 1  LIVING ENVIRONMENT: Lives with: lives with their family and lives with their spouse Lives in: House/apartment Stairs: No Has following equipment at home: None  OCCUPATION: retired Paediatric nurse  PLOF: Independent and gym 4x/week, doing his own yard work  was walking a mile a few times a week  PATIENT GOALS: be stronger, have better  endurance, do more without having to rest, do my yardwork  NEXT MD VISIT: none scheduled until October  OBJECTIVE:   DIAGNOSTIC FINDINGS: none  COGNITION: Overall cognitive status: Within functional limits for tasks assessed     SENSATION: WFL  POSTURE: rounded shoulders, forward head, and decreased lumbar lordosis  PALPATION: Good mm tone  LOWER EXTREMITY ROM:  Active ROM Right eval Left eval  Hip flexion    Hip extension    Hip abduction    Hip adduction    Hip internal rotation    Hip external rotation    Knee flexion    Knee extension    Ankle dorsiflexion    Ankle plantarflexion    Ankle inversion     Ankle eversion     (Blank rows = not tested)  LOWER EXTREMITY MMT:  MMT Right eval Left eval  Hip flexion 4- 4-  Hip extension 4- 4-  Hip abduction 4- 4-  Hip adduction    Hip internal rotation    Hip external rotation    Knee flexion 4 4  Knee extension 4 4  Ankle dorsiflexion 4 4  Ankle plantarflexion 3 3  Ankle inversion    Ankle eversion     (Blank rows = not tested) UE strength: 4/5 FUNCTIONAL TESTS:  5 times sit to stand: 16 seconds used hands on legs Timed up and go (TUG): 13 seconds 3 minute walk test: 660 feet RPE 7   08/07/22 800 feet 6 minutes   GAIT: Distance walked: 660 feet Assistive device utilized: None Level of assistance: Complete Independence Comments: good speed reports that he is always looking for a place to sit down   TODAY'S TREATMENT:                                                                                                                              DATE:  08/14/22 Nustep level 5 x 7 minutes Leg curls 35# 2x12 Leg extension 10# 2x12 Airex balance beam side stepping, tandem walking 50# leg press 2x10 Shoulder Ext 10lb 2x10 Triceps 35# very difficult, 25lb   08/09/22 Nustep level 5 x 7 minutes Airex balance beam side stepping, tandem walking Upside down bosu balance and then reaching Leg curls 35# 2x12 Leg extension 10# 2x12 Biceps 10# Triceps 35# very difficult 10# straight arm pulls 50# leg press 2x10 Again talked more about the gym equipment  08/07/22 Nustep level 5 x 7 minutes Gait outside around the back building good pace 800 feet 6 minutes 35# HS curls 2x10 Leg extension 10# 2x12 25# triceps 10# biceps Leg press 40# 2x12 Seated row 25# Lats 25# Side stepping on and off airex  On iarex ball toss  08/02/22 NuStep L5 x 7 min HS curls 35lb 2x12 Leg Ext 10lb 2x12 S2S OHP blue ball 2x12  Seated Rows & Lats 35lb 2x10 Chest press 10lb 2x10 Triceps Ext 20lb 2x15 6in box on airex step ups x10 each  07/31/22 Bike L4 x  6 min Checked Goals  Gait around back building around wildest part of parking lot RPE 3 Leg press 60lb 2x15  S2S OHP blue ball 2x10 HS curls 35lb 2x12 Leg Ext 10lb 2x12   07/26/22 Bike 3.5 x 6 min HS curls 35lb 2x10 Leg Ext 10lb 2x10 S2S OHP yellow ball 2x10 Seated rows & Lats 35lb 2x10 Leg press 50lb 2x20 Calf stretch 3x10''  07/24/22 UBE L2 x 2 min each way Bike L 4 x 4 min S2S OHP yellow ball 2x10 Shoulder Ext 5lb 2x10 Seated HS curls 25lb 2x12 Leg Ext 10lb 2x12 Step ups 6in x 10 each Lateral step ups 6in x10 each Heel raised 2x10 black bar  PATIENT EDUCATION:  Education details: POC Person educated: Patient and Spouse Education method: Explanation Education comprehension: verbalized understanding  HOME EXERCISE PROGRAM: Access Code: [email or web address] URL: [email or web address] Date: 07/26/2022 Prepared by: Debroah Baller  Exercises - Sit to Stand Without Arm Support  - 1 x daily - 7 x weekly - 3 sets - 10 reps - Standing March  - 1 x daily - 7 x weekly - 3 sets - 10 reps - Supine Bridge  - 1 x daily - 7 x weekly - 3 sets - 10 reps  ASSESSMENT:  CLINICAL IMPRESSION: Pt reports going back to the gym and doing the machines. 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 Postural cue needed with standing shoulder extensions. Some difficulty present with tandem walking on airex. No pain during session.  OBJECTIVE IMPAIRMENTS: Abnormal gait, cardiopulmonary status limiting activity, decreased activity tolerance, decreased balance, decreased coordination, decreased endurance, decreased mobility, difficulty walking, decreased ROM, decreased strength, impaired flexibility, postural dysfunction, and pain.   REHAB POTENTIAL: Good  CLINICAL DECISION MAKING: Stable/uncomplicated  EVALUATION COMPLEXITY: Low   GOALS: Goals reviewed with patient? Yes  SHORT TERM GOALS: Target date: 07/26/22 Independent with initial  HEP Goal status: Met 07/31/22  LONG TERM GOALS: Target date: 10/12/22  Independent with advanced HEP or gym program Goal status: progressing 08/07/22  2.  Increase hip strength to 4+/5 Goal status: progressing 08/07/22  3.  Decrease TUG time to 10 seconds Goal status: Met 8.78 07/31/22  4.  Decrease 5X STS time to 12 seconds Goal status: met  07/31/22 10.54  5.  Increase ability to walk to >1500 feet without RPE >4 Goal status: met 07/31/22  PLAN:  PT FREQUENCY: 1-2x/week  PT DURATION: 12 weeks  PLANNED INTERVENTIONS: Therapeutic exercises, Therapeutic activity, Neuromuscular re-education, Balance training, Gait training, Patient/Family education, Self Care, Joint mobilization, and Manual therapy  PLAN FOR NEXT SESSION: work on strength, balance, endurance and overall functional abilities, continue to prep him for him being able to be safe and independent with gym activities in the next month to 6 weeks  Stacie Glaze, PT 08/14/2022, 2:25 PM

## 2022-08-16 ENCOUNTER — Encounter: Payer: Self-pay | Admitting: Physical Therapy

## 2022-08-16 ENCOUNTER — Ambulatory Visit: Payer: Medicare Other | Admitting: Physical Therapy

## 2022-08-16 DIAGNOSIS — R262 Difficulty in walking, not elsewhere classified: Secondary | ICD-10-CM

## 2022-08-16 DIAGNOSIS — M6281 Muscle weakness (generalized): Secondary | ICD-10-CM

## 2022-08-16 NOTE — Therapy (Signed)
OUTPATIENT PHYSICAL THERAPY LOWER EXTREMITY TREATMENT Progress Note Reporting Period 07/12/22 to 08/16/22  See note below for Objective Data and Assessment of Progress/Goals.      Patient Name: Joseph Landry MRN: [id number] DOB:10/21/1940, 82 y.o., male Today's Date: 08/16/2022  END OF SESSION:  PT End of Session - 08/16/22 1347     Visit Number 10    Date for PT Re-Evaluation 10/12/22    PT Start Time 1345    PT Stop Time 1430    PT Time Calculation (min) 45 min    Activity Tolerance Patient tolerated treatment well    Behavior During Therapy WFL for tasks assessed/performed              Past Medical History:  Diagnosis Date   Arthritis    Dyslipidemia     on  simvastatin    Essential hypertension    Labile; often elevated in clinic visits, but never at home   Kidney disease    Permanent atrial fibrillation (HCC)    Rate controlled with beta blocker; anticoagulation with Eliquis   Ulcer of ankle (HCC)    right ankle; s/p I&D   Varicose veins    s/p R GSFA Endovenous Ablation   Wears glasses    Wears hearing aid    both ears   Wears partial dentures    Past Surgical History:  Procedure Laterality Date   APPLICATION OF A-CELL OF EXTREMITY Right 12/31/2013   Procedure: PLACEMENT OF ACELL RIGHT MEDIAL ANKLE;  Surgeon: Wayland Denis, DO;  Location: [facility];  Service: Plastics;  Laterality: Right;   COLONOSCOPY     ENDOVENOUS ABLATION SAPHENOUS VEIN W/ LASER Right 01-14-2014   EVLA right greater saphenous vein by Gretta Began MD   event monitor  08/22/2011-09/20/2011   shows a fib- startedon FLECAINDE   I & D EXTREMITY Right 12/31/2013   Procedure: IRRIGATION AND DEBRIDEMENT RIGHT ANKLE WOUND;  Surgeon: Wayland Denis, DO;  Location: [facility];  Service: Plastics;  Laterality: Right;   NASAL SINUS SURGERY     NM MYOVIEW LTD - Persantine  August 2012   No infarct or ischemia.   TRANSTHORACIC ECHOCARDIOGRAM  August 2012   Normal LV  size and function, normal LA size, EF> 55%.   Patient Active Problem List   Diagnosis Date Noted   Hypercoagulable state due to atrial fibrillation (HCC) 02/22/2021   CKD (chronic kidney disease), stage III (HCC) 11/13/2019   Venous stasis dermatitis of both lower extremities 05/15/2019   H/O hematuria 08/18/2013   H/O: GI bleed 08/18/2013   Permanent atrial fibrillation (HCC) CHA2DS2Vasc Score 4, On Eliquis    Dyslipidemia, goal LDL below 100    Essential hypertension     PCP: Merri Brunette, MD  REFERRING PROVIDER: Renne Crigler, MD  REFERRING DIAG: malaise, fatigue, difficulty walking  THERAPY DIAG:  Muscle weakness (generalized)  Difficulty in walking, not elsewhere classified  Rationale for Evaluation and Treatment: Rehabilitation  ONSET DATE: 07/09/22  SUBJECTIVE:   SUBJECTIVE STATEMENT: Feeling pretty good, walking good, breathing good PERTINENT HISTORY: See above PAIN:  Are you having pain? No  PRECAUTIONS: Fall  RED FLAGS: None   WEIGHT BEARING RESTRICTIONS: No  FALLS:  Has patient fallen in last 6 months? Yes. Number of falls 1  LIVING ENVIRONMENT: Lives with: lives with their family and lives with their spouse Lives in: House/apartment Stairs: No Has following equipment at home: None  OCCUPATION: retired Paediatric nurse  PLOF: Independent and gym 4x/week, doing  his own yard work  was walking a mile a few times a week  PATIENT GOALS: be stronger, have better endurance, do more without having to rest, do my yardwork  NEXT MD VISIT: none scheduled until October  OBJECTIVE:   DIAGNOSTIC FINDINGS: none  COGNITION: Overall cognitive status: Within functional limits for tasks assessed     SENSATION: WFL  POSTURE: rounded shoulders, forward head, and decreased lumbar lordosis  PALPATION: Good mm tone  LOWER EXTREMITY ROM:  Active ROM Right eval Left eval  Hip flexion    Hip extension    Hip abduction    Hip adduction    Hip internal rotation    Hip  external rotation    Knee flexion    Knee extension    Ankle dorsiflexion    Ankle plantarflexion    Ankle inversion    Ankle eversion     (Blank rows = not tested)  LOWER EXTREMITY MMT:  MMT Right eval Left eval Right 08/16/22 Left 08/16/22  Hip flexion 4- 4- 4+ 4+  Hip extension 4- 4- 4+ 4+  Hip abduction 4- 4- 5 5  Hip adduction      Hip internal rotation      Hip external rotation      Knee flexion 4 4 5 5   Knee extension 4 4 5 5   Ankle dorsiflexion 4 4 4+ 4+  Ankle plantarflexion 3 3 3 3   Ankle inversion      Ankle eversion       (Blank rows = not tested) UE strength: 4/5 FUNCTIONAL TESTS:  5 times sit to stand: 16 seconds used hands on legs Timed up and go (TUG): 13 seconds 3 minute walk test: 660 feet RPE 7   08/07/22 800 feet 6 minutes   GAIT: Distance walked: 660 feet Assistive device utilized: None Level of assistance: Complete Independence Comments: good speed reports that he is always looking for a place to sit down   TODAY'S TREATMENT:                                                                                                                              DATE:  08/16/22 Nustep level 5 x 7 minutes Checked goals Leg press 60lb 2x10, heel raises 2x10 60lb Leg curls 35# 2x15 Leg extension 10# 2x15 Triceps 25lb 2x10 6in step ups x10 each  08/14/22 Nustep level 5 x 7 minutes Leg curls 35# 2x12 Leg extension 10# 2x12 Airex balance beam side stepping, tandem walking 50# leg press 2x10 Shoulder Ext 10lb 2x10 Triceps 35# very difficult, 25lb   08/09/22 Nustep level 5 x 7 minutes Airex balance beam side stepping, tandem walking Upside down bosu balance and then reaching Leg curls 35# 2x12 Leg extension 10# 2x12 Biceps 10# Triceps 35# very difficult 10# straight arm pulls 50# leg press 2x10 Again talked more about the gym equipment  08/07/22 Nustep level 5 x 7 minutes Gait outside around the  back building good pace 800 feet 6 minutes 35# HS curls  2x10 Leg extension 10# 2x12 25# triceps 10# biceps Leg press 40# 2x12 Seated row 25# Lats 25# Side stepping on and off airex  On iarex ball toss  08/02/22 NuStep L5 x 7 min HS curls 35lb 2x12 Leg Ext 10lb 2x12 S2S OHP blue ball 2x12  Seated Rows & Lats 35lb 2x10 Chest press 10lb 2x10 Triceps Ext 20lb 2x15 6in box on airex step ups x10 each 07/31/22 Bike L4 x 6 min Checked Goals  Gait around back building around wildest part of parking lot RPE 3 Leg press 60lb 2x15  S2S OHP blue ball 2x10 HS curls 35lb 2x12 Leg Ext 10lb 2x12   07/26/22 Bike 3.5 x 6 min HS curls 35lb 2x10 Leg Ext 10lb 2x10 S2S OHP yellow ball 2x10 Seated rows & Lats 35lb 2x10 Leg press 50lb 2x20 Calf stretch 3x10''  07/24/22 UBE L2 x 2 min each way Bike L 4 x 4 min S2S OHP yellow ball 2x10 Shoulder Ext 5lb 2x10 Seated HS curls 25lb 2x12 Leg Ext 10lb 2x12 Step ups 6in x 10 each Lateral step ups 6in x10 each Heel raised 2x10 black bar  PATIENT EDUCATION:  Education details: POC Person educated: Patient and Spouse Education method: Explanation Education comprehension: verbalized understanding  HOME EXERCISE PROGRAM: Access Code: [email or web address] URL: [email or web address] Date: 07/26/2022 Prepared by: Debroah Baller  Exercises - Sit to Stand Without Arm Support  - 1 x daily - 7 x weekly - 3 sets - 10 reps - Standing March  - 1 x daily - 7 x weekly - 3 sets - 10 reps - Supine Bridge  - 1 x daily - 7 x weekly - 3 sets - 10 reps  ASSESSMENT:  CLINICAL IMPRESSION: Pt reports going back to the gym and doing the machines. 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 He had progressed meeting all goals and is pleased with his current functional status. No pain during session.  OBJECTIVE IMPAIRMENTS: Abnormal gait, cardiopulmonary status limiting activity, decreased activity tolerance, decreased balance, decreased coordination, decreased endurance,  decreased mobility, difficulty walking, decreased ROM, decreased strength, impaired flexibility, postural dysfunction, and pain.   REHAB POTENTIAL: Good  CLINICAL DECISION MAKING: Stable/uncomplicated  EVALUATION COMPLEXITY: Low   GOALS: Goals reviewed with patient? Yes  SHORT TERM GOALS: Target date: 07/26/22 Independent with initial HEP Goal status: Met 07/31/22  LONG TERM GOALS: Target date: 10/12/22  Independent with advanced HEP or gym program Goal status: Met 08/07/22  2.  Increase hip strength to 4+/5 Goal status: Met 08/16/22  3.  Decrease TUG time to 10 seconds Goal status: Met 8.78 07/31/22  4.  Decrease 5X STS time to 12 seconds Goal status: met  07/31/22 10.54  5.  Increase ability to walk to >1500 feet without RPE >4 Goal status: met 07/31/22  PLAN:  PT FREQUENCY: 1-2x/week  PT DURATION: 12 weeks  PLANNED INTERVENTIONS: Therapeutic exercises, Therapeutic activity, Neuromuscular re-education, Balance training, Gait training, Patient/Family education, Self Care, Joint mobilization, and Manual therapy  PLAN FOR NEXT SESSION: D/C PT PHYSICAL THERAPY DISCHARGE SUMMARY  Visits from Start of Care: 10    Patient agrees to discharge. Patient goals were met. Patient is being discharged due to meeting the stated rehab goals.  Debroah Baller, PTA 08/16/2022, 1:48 PM

## 2022-08-21 ENCOUNTER — Ambulatory Visit: Payer: Medicare Other | Admitting: Physical Therapy

## 2022-08-23 ENCOUNTER — Ambulatory Visit: Payer: Medicare Other | Admitting: Physical Therapy

## 2022-09-04 ENCOUNTER — Ambulatory Visit: Payer: Medicare Other | Admitting: Physical Therapy

## 2022-09-06 ENCOUNTER — Ambulatory Visit: Payer: Medicare Other | Admitting: Physical Therapy

## 2023-01-25 ENCOUNTER — Ambulatory Visit: Payer: Medicare Other | Attending: Cardiology | Admitting: Cardiology

## 2023-01-25 ENCOUNTER — Encounter: Payer: Self-pay | Admitting: Cardiology

## 2023-01-25 VITALS — BP 134/62 | HR 44 | Ht 71.0 in | Wt 183.0 lb

## 2023-01-25 DIAGNOSIS — I872 Venous insufficiency (chronic) (peripheral): Secondary | ICD-10-CM

## 2023-01-25 DIAGNOSIS — D6869 Other thrombophilia: Secondary | ICD-10-CM

## 2023-01-25 DIAGNOSIS — E785 Hyperlipidemia, unspecified: Secondary | ICD-10-CM | POA: Diagnosis present

## 2023-01-25 DIAGNOSIS — N1831 Chronic kidney disease, stage 3a: Secondary | ICD-10-CM

## 2023-01-25 DIAGNOSIS — I10 Essential (primary) hypertension: Secondary | ICD-10-CM

## 2023-01-25 DIAGNOSIS — R001 Bradycardia, unspecified: Secondary | ICD-10-CM | POA: Diagnosis present

## 2023-01-25 DIAGNOSIS — I4821 Permanent atrial fibrillation: Secondary | ICD-10-CM

## 2023-01-25 MED ORDER — METOPROLOL SUCCINATE ER 25 MG PO TB24: 25.0000 mg | ORAL_TABLET | Freq: Every day | ORAL | 4 refills | Status: AC

## 2023-01-25 MED ORDER — FLECAINIDE ACETATE 50 MG PO TABS
ORAL_TABLET | ORAL | 11 refills | Status: AC
Start: 2023-01-25 — End: ?

## 2023-01-25 NOTE — Progress Notes (Unsigned)
Cardiology Office Note:  .   Date:  01/27/2023  ID:  NOLYN SWAB, DOB April 05, 1940, MRN [id number] PCP: Merri Brunette, MD  [facility] Providers Cardiologist:  Bryan Lemma, MD     Chief Complaint  Patient presents with   Follow-up    12 months.   Atrial Fibrillation    Pretty asymptomatic.  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  Now doing better.    Patient Profile: .     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  Longstanding A-fib-now permanent having failed rhythm control.  Rate control with Toprol (along with PRN flecainide for breakthrough tachycardia), but has had issues with bradycardia.  On renally dosed Eliquis. Mostly asymptomatic    I last saw Joseph Landry in March 2023 -> he was doing well from cardiac perspective.  We change his metoprolol to once daily as opposed to twice daily due to low heart rate.  Patient was still working out most days of the week and feeling well.  He was last seen on January 23, 2020 by Wallis Bamberg, NP-IV was still doing well working out 4 days a week.  Maintaining a BP log with well-controlled pressures.  Followed by nephrology and PCP.  No medication changes made.  He indicated that he had not had to use his PRN flecainide in over a year.  Subjective  Discussed the use of AI scribe software for clinical note transcription with the patient, who gave verbal consent to proceed.  History of Present Illness   The patient, with a history of longstanding Permanent Afib (having failed rhythm control) has been managing well with his current regimen and has not experienced any significant health issues since his last visit. He has been actively engaging in physical activities such as mowing the lawn and working out at [employer], which he attributes to his improved health status. He reports a significant weight  loss of about forty pounds and expresses that he feels the best he has in twenty years.  The patient had a recent bout of COVID-19, which he contracted during a trip to [place]. 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 He underwent therapy for two and a half months, which he found beneficial, particularly in learning how to use gym machines effectively. He reports that his breathing has significantly improved since his recovery from COVID-19.  He has been monitoring his blood pressure regularly, with readings mostly in the 120s over 70s range. He has noticed occasional spikes to 160, but these are transient and resolve on their own.  The patient is currently on a regimen of metoprolol succinate 50 mg qhs, Eliquis 2.5 mg BID (renally dosed), and amlodipine 5 mg daily along with rosuvastatin 30 mg daily & fish oil. He has not needed to use his prescription for flecainide in the past two years. He expresses a desire to continue his physical activities and further improve his health.   The patient also mentions a longstanding hernia that occasionally causes discomfort. However, he has not sought any surgical intervention for this issue.      He denies chest pain, palpitations, dyspnea, pnd, orthopnea, n, v, dizziness, syncope, edema, weight gain, or early satiety. Denies hematochezia, hemoptysis, and hematuria. No recent falls.  - Just notes a little less "get  up & go"    Objective  Current Meds  Medication Sig   amLODipine (NORVASC) 5 MG tablet TAKE 1 TABLET(5 MG) BY MOUTH DAILY   ELIQUIS 2.5 MG TABS tablet TAKE 1 TABLET(2.5 MG) BY MOUTH TWICE DAILY   folic acid (FOLVITE) 400 MCG tablet Take 400 mcg by mouth daily.    Omega-3 Fatty Acids (FISH OIL) 1000 MG CAPS Take by mouth.   rosuvastatin (CRESTOR) 20 MG tablet Take 20 mg by mouth daily.   Vitamin D, Cholecalciferol, 50 MCG (2000 UT) CAPS Take 1 capsule by mouth daily.    []  metoprolol succinate (TOPROL-XL) 100 MG 24 hr tablet Take 50 mg by mouth at bedtime. Discontinued today - reduced to 25 mg   flecainide (TAMBOCOR) 50 MG tablet - PRN Take 2 tablets on the onset of fast heartrate , continue 50 mg twice a day until heart rate is back to normal as needed (Patient taking differently: Take 50 mg by mouth as needed. Take 2 tablets on the onset of fast heartrate , continue 50 mg twice a day until heart rate is back to normal as needed)    Studies Reviewed: Marland Kitchen   EKG Interpretation Date/Time:  Friday January 25 2023 09:45:24 EST Ventricular Rate:  44 PR Interval:    QRS Duration:  94 QT Interval:  456 QTC Calculation: 389 R Axis:   81  Text Interpretation: Atrial fibrillation with slow ventricular response When compared with ECG of 06-May-2019 20:03, Rate slower Otherwise no significant change Confirmed by Bryan Lemma ([id number]) on 01/25/2023 10:07:35 AM   None Labs: 06/15/2022: TC 146, HDL 36, LDL 84, TG 147; Hgb 14.1 10/16/2022: 1.79   Risk Assessment/Calculations:    CHA2DS2-VASc Score = 4   This indicates a 4.8% annual risk of stroke. The patient's score is based upon: CHF History: 0 HTN History: 1 Diabetes History: 0 Stroke History: 0 Vascular Disease History: 1 Age Score: 2 Gender Score: 0   On Eliquis - renally dosed       Physical Exam:   VS:  BP 134/62   Pulse (!) 44   Ht 5\' 11"  (1.803 m)   Wt 183 lb (83 kg)   BMI 25.52 kg/m    Wt Readings from Last 3 Encounters:  01/25/23 183 lb (83 kg)  01/22/22 201 lb 9.6 oz (91.4 kg)  03/13/21 207 lb 12.8 oz (94.3 kg)    GEN: Healthy appearing; Well nourished, well groomed in no acute distress;  NECK: No JVD; No carotid bruits CARDIAC: Normal S1, S2; RRR, no murmurs, rubs, gallops RESPIRATORY:  Clear to auscultation without rales, wheezing or rhonchi ; nonlabored, good air movement. ABDOMEN: Soft, non-tender, non-distended EXTREMITIES:  No edema; No deformity     ASSESSMENT AND PLAN: .     Problem List Items Addressed This Visit       Cardiology Problems   Dyslipidemia, goal LDL below 100 (Chronic)   Lipids are well-controlled as of June 2024 with LDL 84 on current dose of rosuvastatin 20 mg daily. -Continue rosuvastatin      Relevant Medications   metoprolol succinate (TOPROL XL) 25 MG 24 hr tablet   flecainide (TAMBOCOR) 50 MG tablet   Essential hypertension (Chronic)   Well controlled on current regimen of Metoprolol and Amlodipine. -Continue current regimen but reduce Toprol to 25 mg daily -Monitor blood pressure and adjust Amlodipine dose if necessary after Metoprolol reduction.      Relevant Medications   metoprolol succinate (TOPROL XL) 25 MG 24  hr tablet   flecainide (TAMBOCOR) 50 MG tablet   Other Relevant Orders   EKG 12-Lead (Completed)   Permanent atrial fibrillation (HCC) CHA2DS2Vasc Score 4, On Eliquis - Primary (Chronic)   No recent episodes of rapid heart rate.  However he has noticed some exercise intolerance when like to get up and go potentially related to bradycardia. Flecainide available as needed. -With bradycardia (with Rates in the 40s), reduce Metoprolol to 25mg  daily. -Continue Eliquis at renal dose. -Keep Flecainide prescription active for peace of mind, though unlikely to be needed.      Relevant Medications   metoprolol succinate (TOPROL XL) 25 MG 24 hr tablet   flecainide (TAMBOCOR) 50 MG tablet   Other Relevant Orders   EKG 12-Lead (Completed)   Venous stasis dermatitis of both lower extremities (Chronic)   Continue foot elevation.      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 Marland KitchenHeart rate in the 40s likely secondary to Metoprolol 50mg  daily.  He denies symptoms of syncope or  pre-syncope, But he has had some exercise intolerance likely related to chronotropic incompetence. -Reduce Metoprolol to 25mg  daily. -Observe for changes in heart rate and blood pressure.      CKD (chronic kidney disease), stage III (HCC) (Chronic)   Baseline creatinine roughly 1.7-1.8 (most recently 1.79) -> Renally dose Eliquis.      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 -Well on Eliquis with dose being reduced from 5 to 5 mg twice daily for renal dosing based on CKD 3A  Okay to hold Eliquis 2 to 3 days preop for surgeries or procedures.  Restart when safe.      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 -Continue current exercise regimen at the gym.  Potential Hernia Occasional discomfort, no current plans for surgical intervention. -If surgery becomes necessary, notify care team to manage perioperative anticoagulation.  Follow-Up: Return in about 1 year (around 01/25/2024) for Routine follow up with me.     Signed, Marykay Lex, MD, MS Bryan Lemma, M.D., M.S. Interventional Cardiologist  [facility]  Pager # [phone] Phone # [phone] [address] [place], [place] [address]

## 2023-01-25 NOTE — Patient Instructions (Addendum)
Medication Instructions:  Stop taking Metoprolol succinate 50 mg   Start taking Metoprolol succinate 25 mg  daily     Monitor your blood pressure, if you notice the blood pressure increasing consistently ,please contact office for further instructions    *If you need a refill on your cardiac medications before your next appointment, please call your pharmacy*   Lab Work: Not needed    Testing/Procedures: Not needed   Follow-Up: At [facility], you and your health needs are our priority.  As part of our continuing mission to provide you with exceptional heart care, we have created designated Provider Care Teams.  These Care Teams include your primary Cardiologist (physician) and Advanced Practice Providers (APPs -  Physician Assistants and Nurse Practitioners) who all work together to provide you with the care you need, when you need it.     Your next appointment:   12 month(s)  The format for your next appointment:   In Person  Provider:   Bryan Lemma, MD

## 2023-01-27 ENCOUNTER — Encounter: Payer: Self-pay | Admitting: Cardiology

## 2023-01-27 DIAGNOSIS — R001 Bradycardia, unspecified: Secondary | ICD-10-CM | POA: Insufficient documentation

## 2023-01-27 NOTE — Assessment & Plan Note (Signed)
Lipids are well-controlled as of June 2024 with LDL 84 on current dose of rosuvastatin 20 mg daily. -Continue rosuvastatin

## 2023-01-27 NOTE — Assessment & Plan Note (Signed)
He has not had any recurrent tachycardia issues but still having bradycardia on current dose of Toprol. Marland KitchenHeart rate in the 40s likely secondary to Metoprolol 50mg  daily.  He denies symptoms of syncope or pre-syncope, But he has had some exercise intolerance likely related to chronotropic incompetence. -Reduce Metoprolol to 25mg  daily. -Observe for changes in heart rate and blood pressure.

## 2023-01-27 NOTE — Assessment & Plan Note (Signed)
CHA2DS2-VASc 4 for with HTN, vascular disease and 2 for age. -Well on Eliquis with dose being reduced from 5 to 5 mg twice daily for renal dosing based on CKD 3A  Okay to hold Eliquis 2 to 3 days preop for surgeries or procedures.  Restart when safe.

## 2023-01-27 NOTE — Assessment & Plan Note (Signed)
Baseline creatinine roughly 1.7-1.8 (most recently 1.79) -> Renally dose Eliquis.

## 2023-01-27 NOTE — Assessment & Plan Note (Signed)
Well controlled on current regimen of Metoprolol and Amlodipine. -Continue current regimen but reduce Toprol to 25 mg daily -Monitor blood pressure and adjust Amlodipine dose if necessary after Metoprolol reduction.

## 2023-01-27 NOTE — Assessment & Plan Note (Signed)
No recent episodes of rapid heart rate.  However he has noticed some exercise intolerance when like to get up and go potentially related to bradycardia. Flecainide available as needed. -With bradycardia (with Rates in the 40s), reduce Metoprolol to 25mg  daily. -Continue Eliquis at renal dose. -Keep Flecainide prescription active for peace of mind, though unlikely to be needed.

## 2023-01-27 NOTE — Assessment & Plan Note (Signed)
Continue foot elevation.

## 2023-02-09 ENCOUNTER — Other Ambulatory Visit: Payer: Self-pay | Admitting: Cardiology

## 2023-02-09 DIAGNOSIS — I4821 Permanent atrial fibrillation: Secondary | ICD-10-CM

## 2023-02-11 NOTE — Telephone Encounter (Signed)
 Prescription refill request for Eliquis  received. Indication:afib Last office visit:1/25 Scr:1.79  10/24 Age: 83 Weight:83  kg  Prescription refilled

## 2023-04-12 ENCOUNTER — Other Ambulatory Visit: Payer: Self-pay | Admitting: Cardiology

## 2023-09-25 ENCOUNTER — Other Ambulatory Visit: Payer: Self-pay | Admitting: Cardiology

## 2023-09-25 DIAGNOSIS — I4821 Permanent atrial fibrillation: Secondary | ICD-10-CM

## 2023-09-25 NOTE — Telephone Encounter (Signed)
 Prescription refill request for Eliquis  received. Indication: PAF Last office visit: 01/25/23  Joseph Clay MD Scr: 1.55 on 07/03/23  [facility] Age: 83 Weight: 83kg  Based on above findings Eliquis  2.5mg  twice daily is the appropriate dose.  Refill approved.

## 2024-01-25 ENCOUNTER — Other Ambulatory Visit: Payer: Self-pay | Admitting: Cardiology
